# Patient Record
Sex: Female | Born: 1975 | Race: White | Hispanic: No | Marital: Single
Health system: Midwestern US, Community
[De-identification: ages and names within clinical notes are randomized; demographics above are authoritative.]

## PROBLEM LIST (undated history)

## (undated) DIAGNOSIS — M5020 Other cervical disc displacement, unspecified cervical region: Secondary | ICD-10-CM

## (undated) DIAGNOSIS — M47812 Spondylosis without myelopathy or radiculopathy, cervical region: Principal | ICD-10-CM

## (undated) DIAGNOSIS — M25561 Pain in right knee: Secondary | ICD-10-CM

## (undated) DIAGNOSIS — M21611 Bunion of right foot: Secondary | ICD-10-CM

---

## 2011-04-20 IMAGING — US US ABDOMEN COMPLETE
1 series · 14 of 25 positions shown · non-contrast
Comparison: none

HISTORY: Left upper quadrant pain.
TECHNIQUE: Abdominal ultrasound.

[Series 1: us abdomen complete · 0.24mm/px · 14 of 39 slices shown]
[im 1/39]
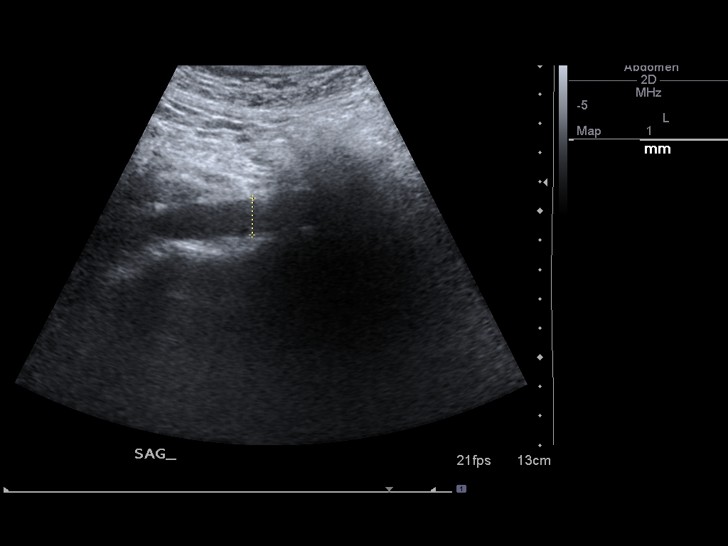
[im 4/39]
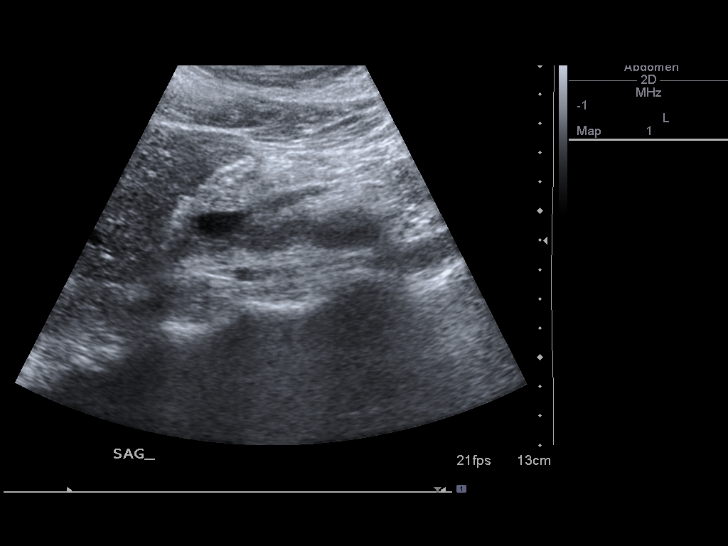
[im 7/39]
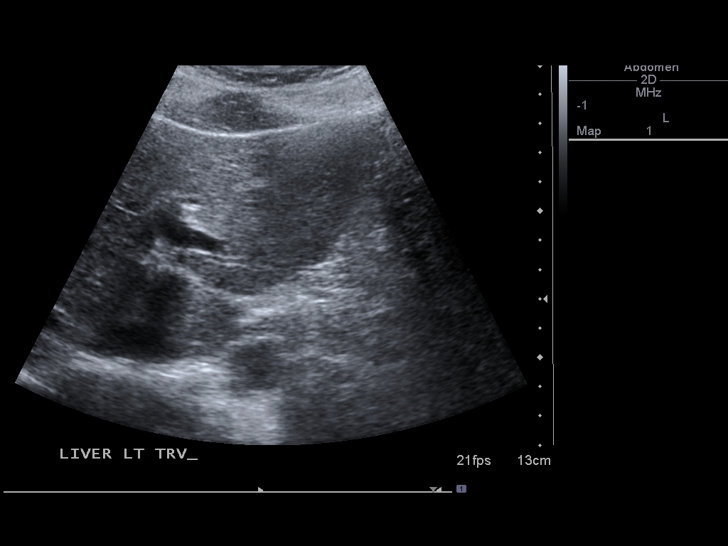
[im 10/39]
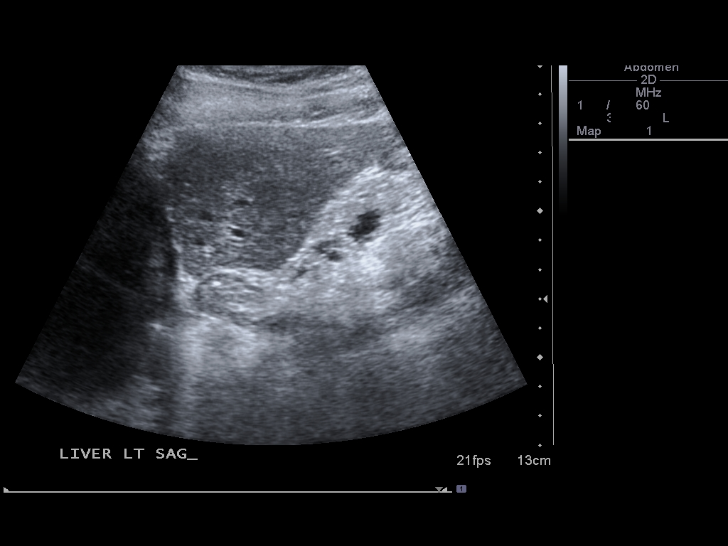
[im 13/39]
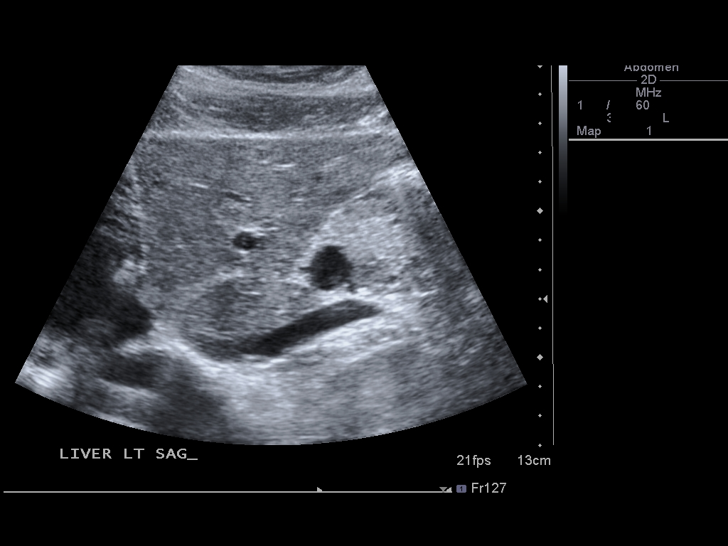
[im 15/39]
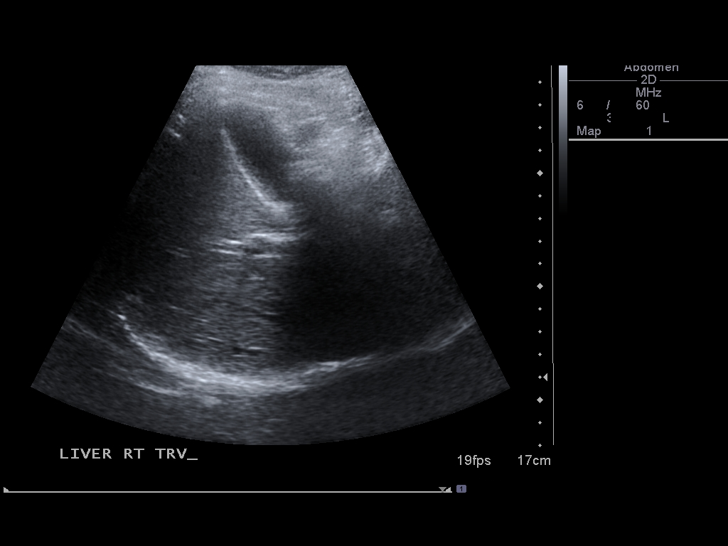
[im 18/39]
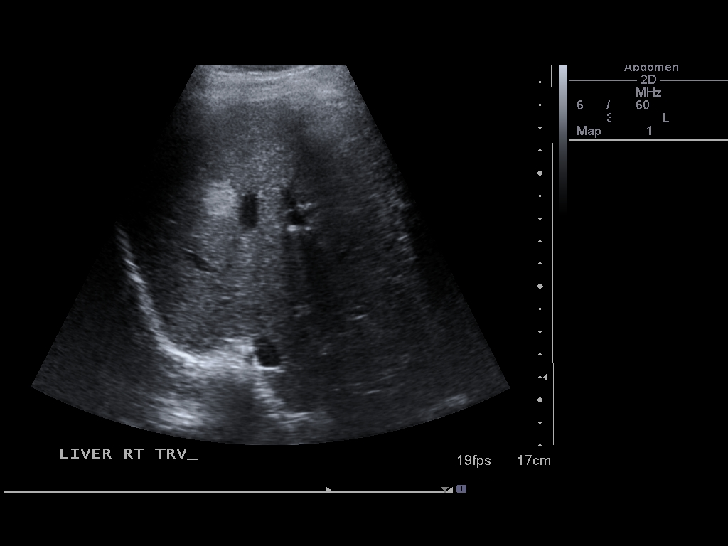
[im 21/39]
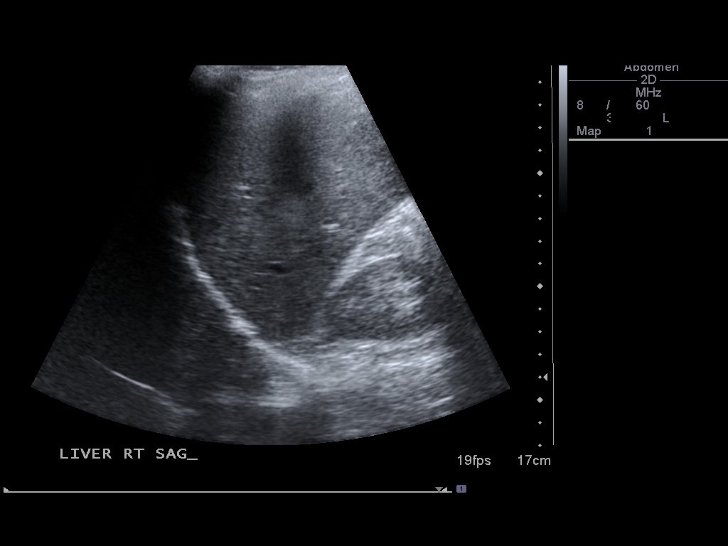
[im 24/39]
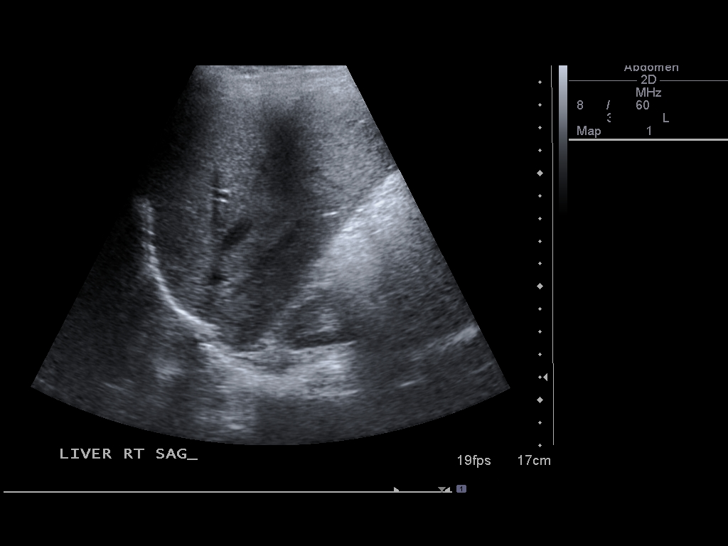
[im 26/39]
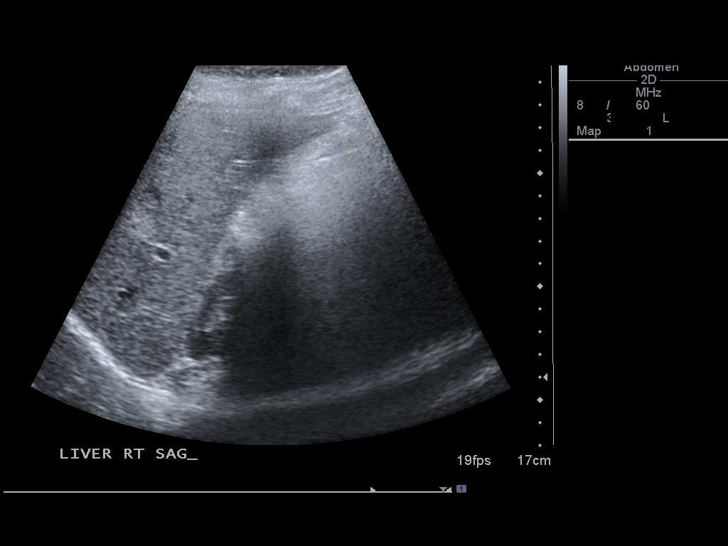
[im 29/39]
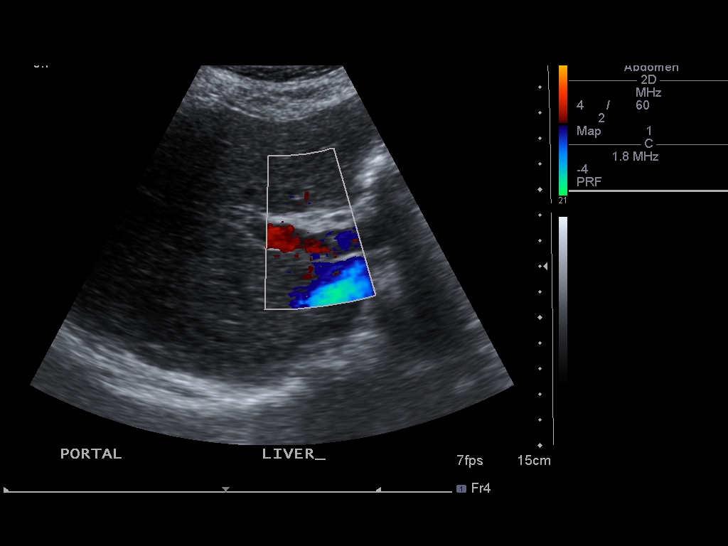
[im 32/39]
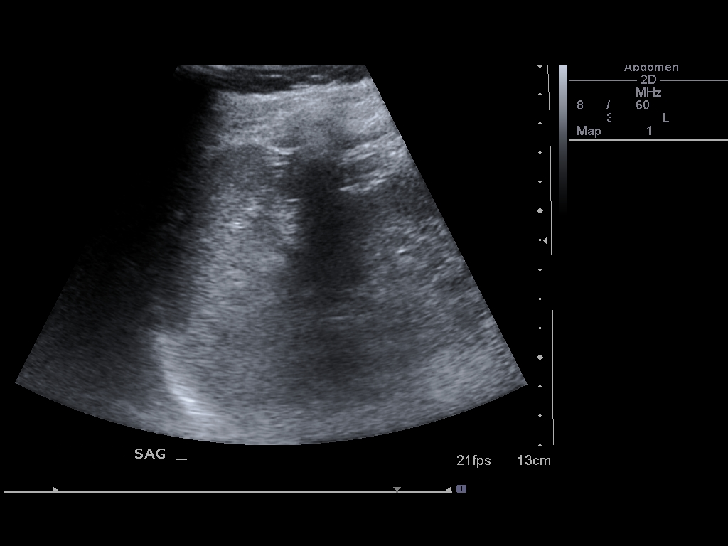
[im 35/39]
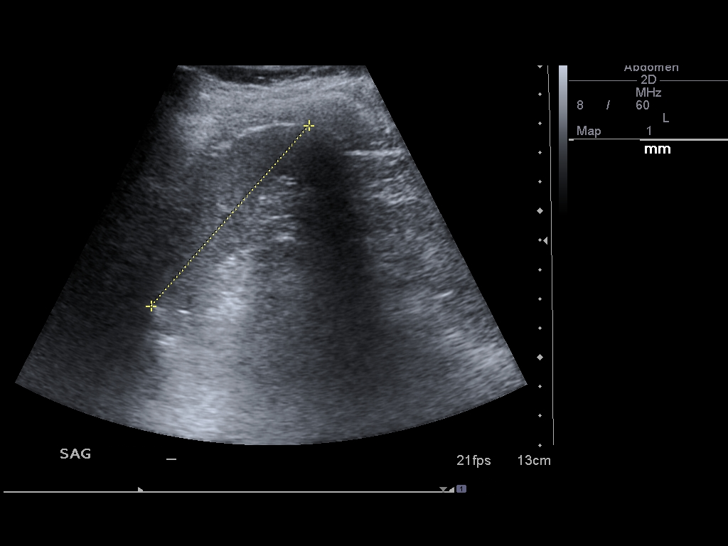
[im 39/39]
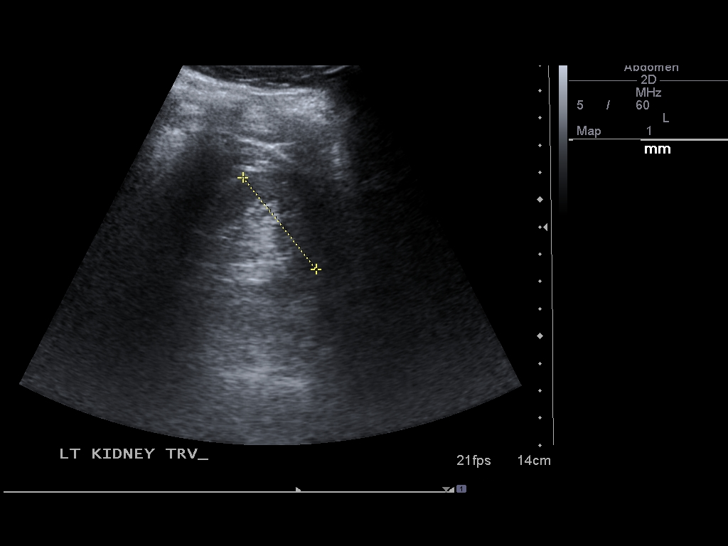

[14 of 25 positions shown; findings below may reference images not displayed]

FINDINGS: The gallbladder shows some mobile debris, no stones, common duct 4 mm, negative sonographic Murphy's sign, gallbladder wall normal.

Liver shows several hypoechoic areas, one in left lobe 11 x 15 mm, in the right lobe 17 x 16 mm. Right lobe is about 14 cm in length. Portal vein flow is appropriately toward the liver.

Aorta and IVC within the abdomen unremarkable.  Pancreas partially seen, appears normal but may not be completely seen.

Spleen is 82 x 30 mm.

Right kidney 106 x 36 x 38 mm, left kidney 107 x 65 x 63 mm.

On previous renal ultrasound from 7434, hyperechoic area in right lobe of about 15 mm was described but the liver is not studied in detail at that time.
IMPRESSION: 1. The liver lesions appear to be homogeneously smooth and hyperechoic are compatible with hemangiomas most likely. View of pain, however, consider followup hemangioma protocol CT.

2. There is some debris without definite stones in gallbladder. No wall thickening. No common duct enlargement.

## 2011-04-20 IMAGING — US US NON OB TRANSVAG W LTD TA
1 series · 14 of 28 positions shown · non-contrast
Comparison: none

HISTORY: Left lower quadrant pain.
TECHNIQUE: Pelvic ultrasound transvaginal and transabdominal.

[Series 1: us non ob transvag w ltd ta · 0.23mm/px · 14 of 39 slices shown]
[im 2/39]
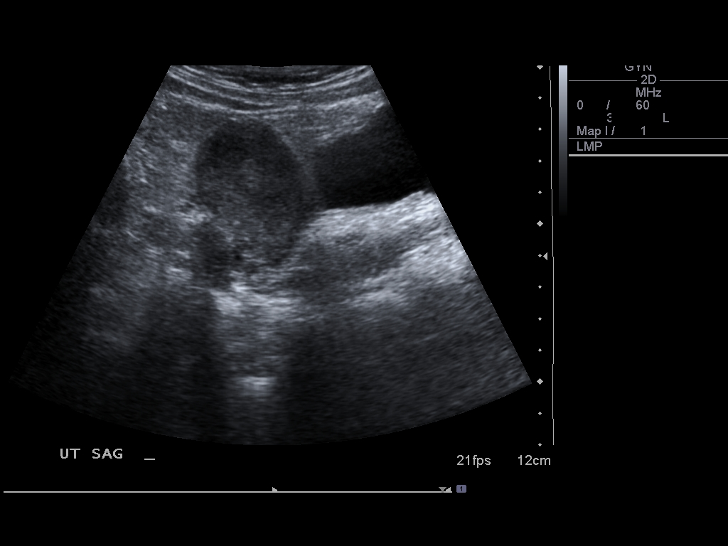
[im 5/39]
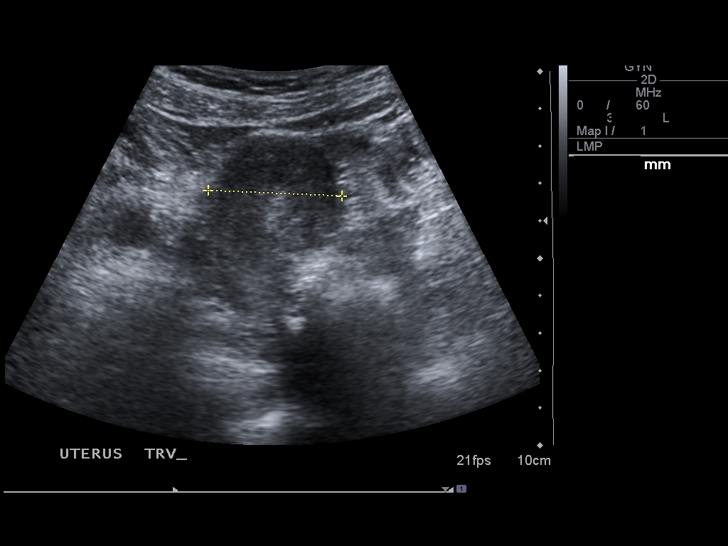
[im 8/39]
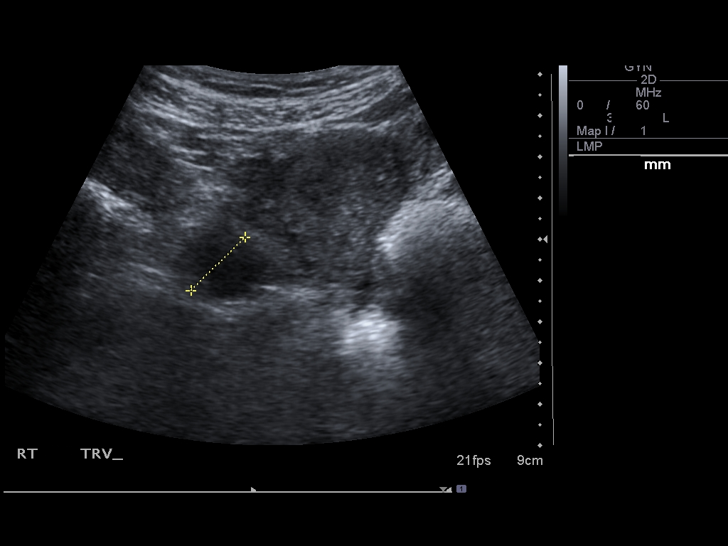
[im 10/39]
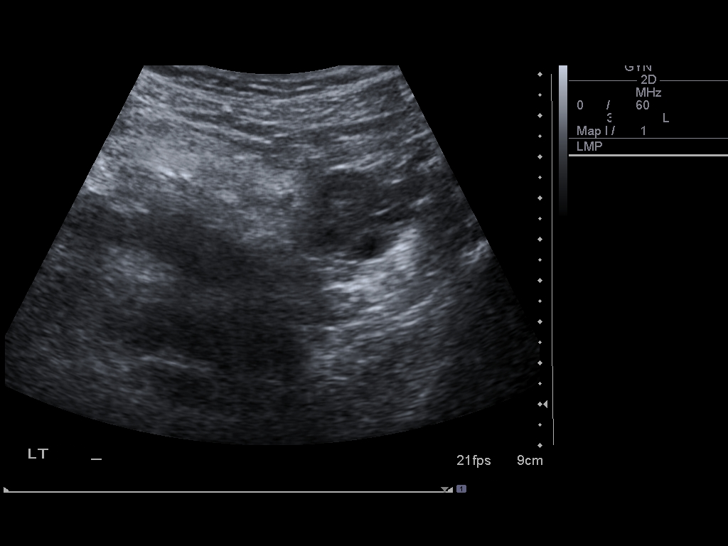
[im 13/39]
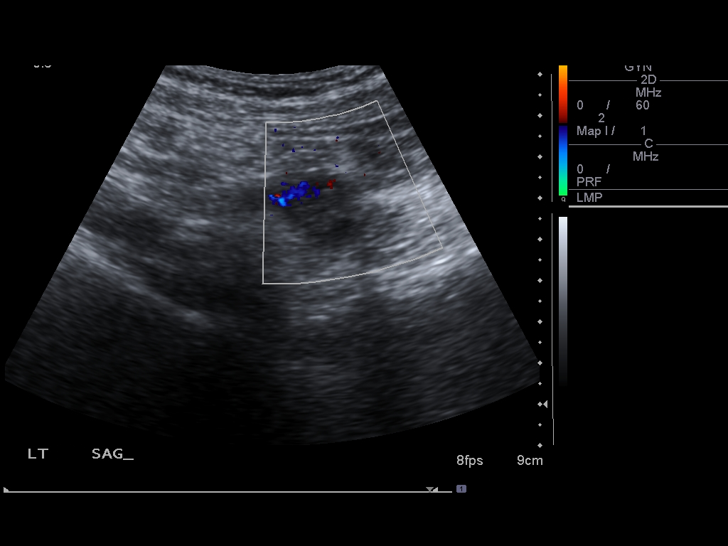
[im 16/39]
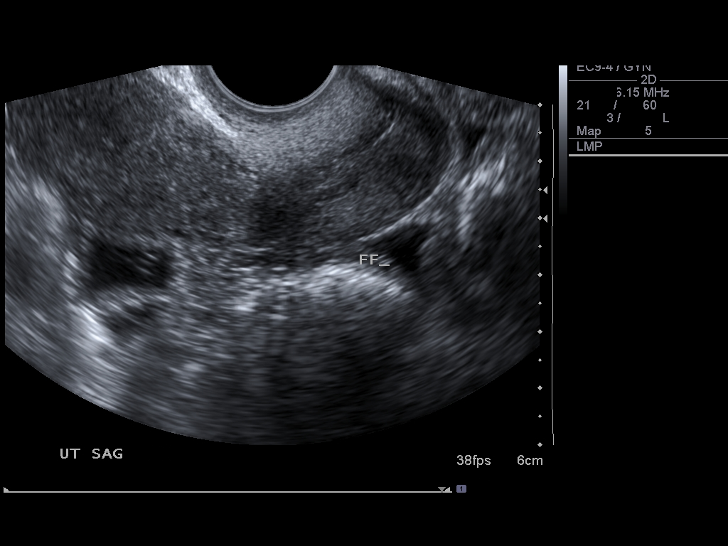
[im 19/39]
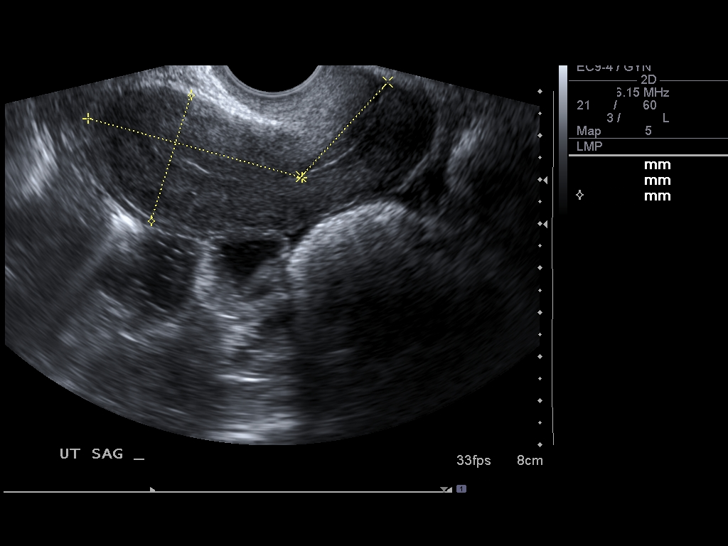
[im 22/39]
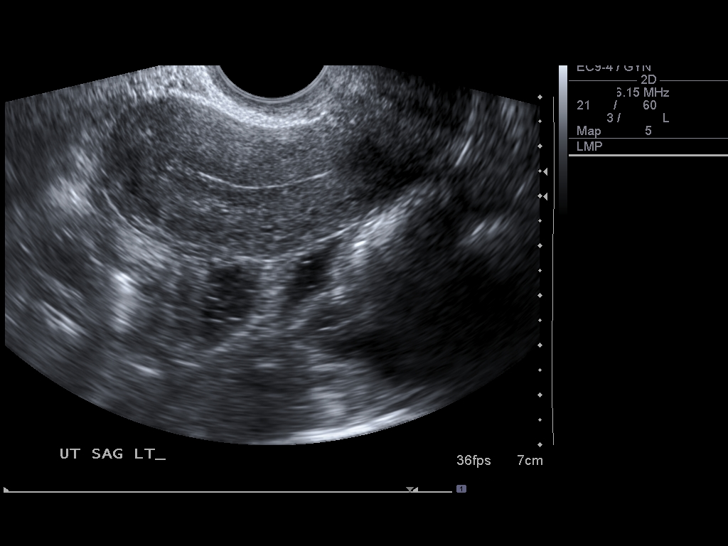
[im 24/39]
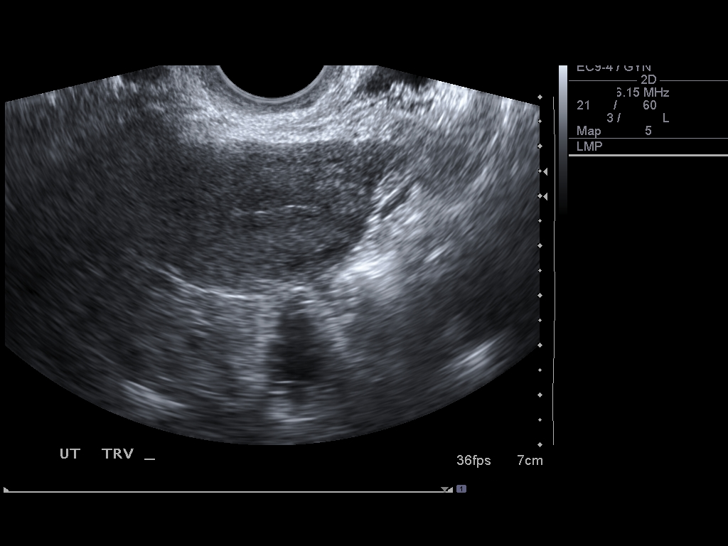
[im 27/39]
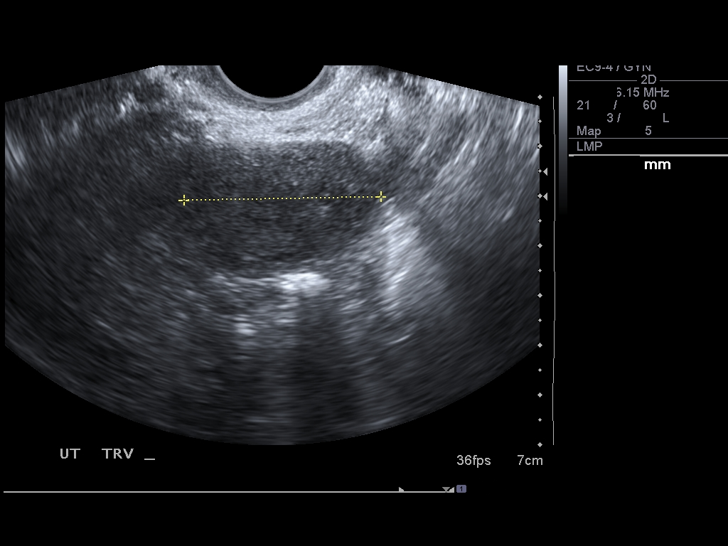
[im 30/39]
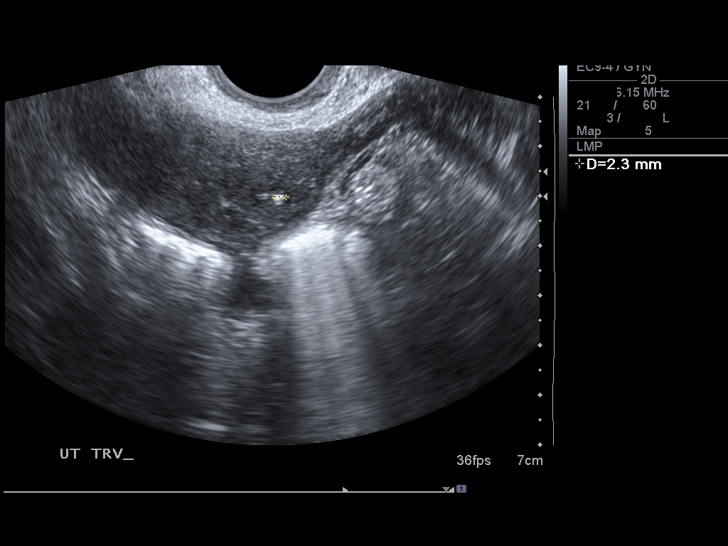
[im 33/39]
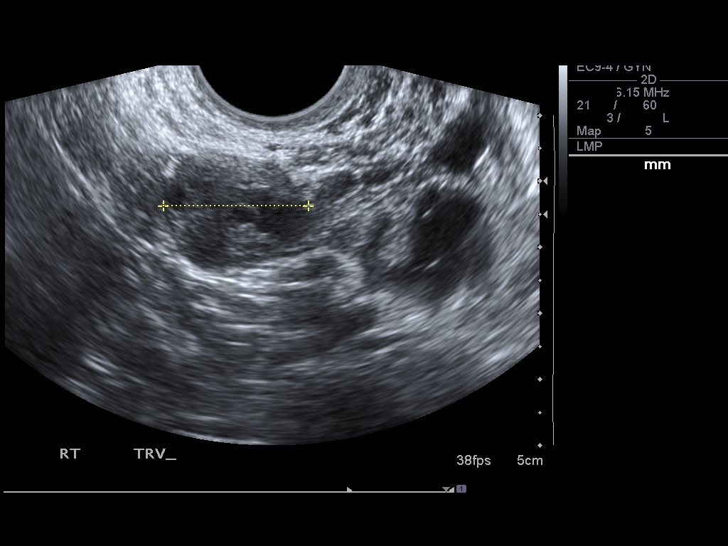
[im 36/39]
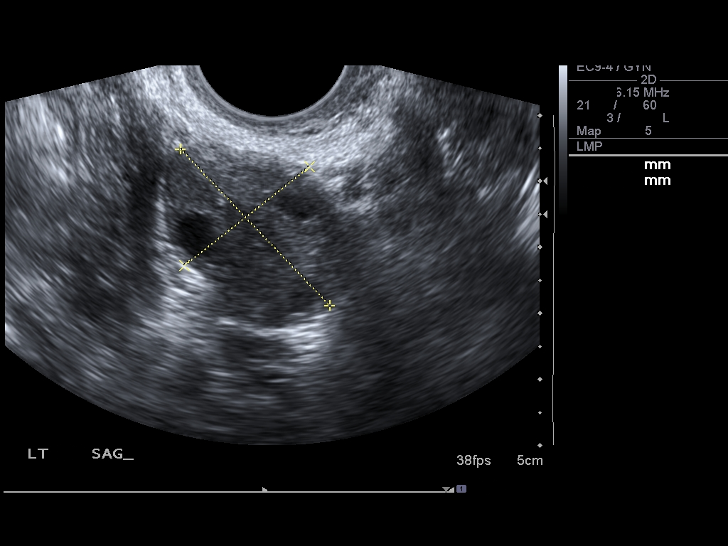
[im 39/39]
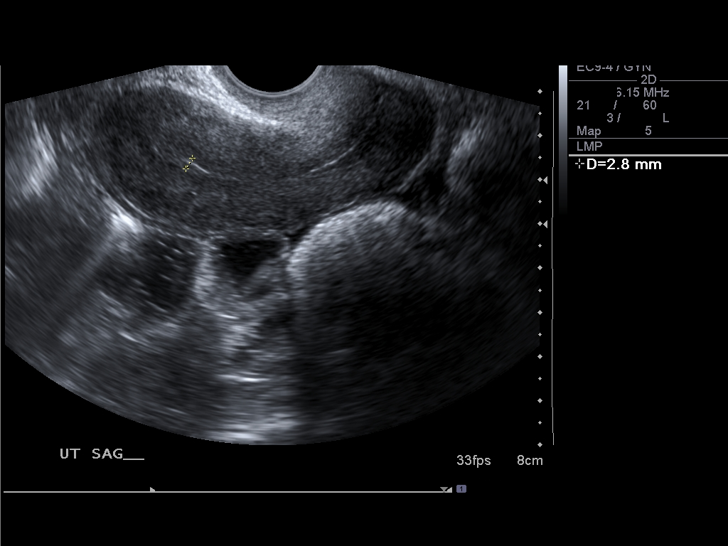

[14 of 28 positions shown; findings below may reference images not displayed]

FINDINGS: Uterus is 79 x 30 x 40 mm. Endometrium 3 mm. Small endometrial calcification seen of about 3 mm.

Right ovary is 26 x 16 x 22 mm, left ovary 33 x 24 x 19 mm.

Small amount of fluid is seen in cul-de-sac. The ovaries show several follicles bilaterally.
IMPRESSION: 1. Small amount of fluid in cul-de-sac.

2. Suggest small endometrial calcification.

3. No uterine fibroid seen other than the small calcification which could be a small submucosal calcified fibroid but is only 3 mm.

## 2011-11-26 IMAGING — US US ABDOMEN COMPLETE
1 series · 13 of 25 positions shown · non-contrast
Comparison: none

HISTORY: Abdomen pain, hemangioma.

Comparison made to abdomen ultrasound study 04/20/2011 and renal ultrasound study 02/26/2008.
TECHNIQUE: Abdomen ultrasound study performed.

[Series 1: us abdomen complete · 0.24mm/px · 13 of 56 slices shown]
[im 1/56]
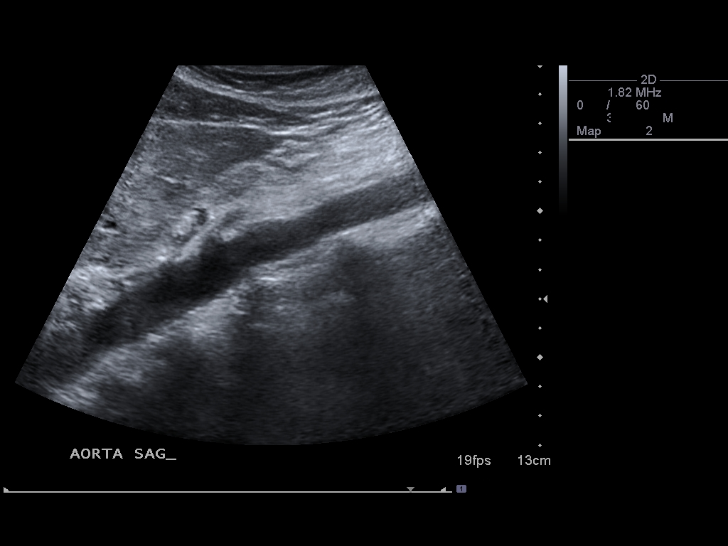
[im 5/56]
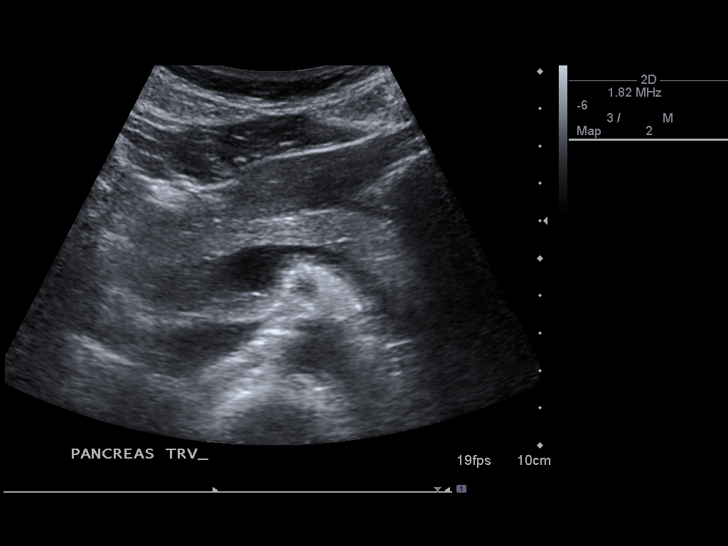
[im 10/56]
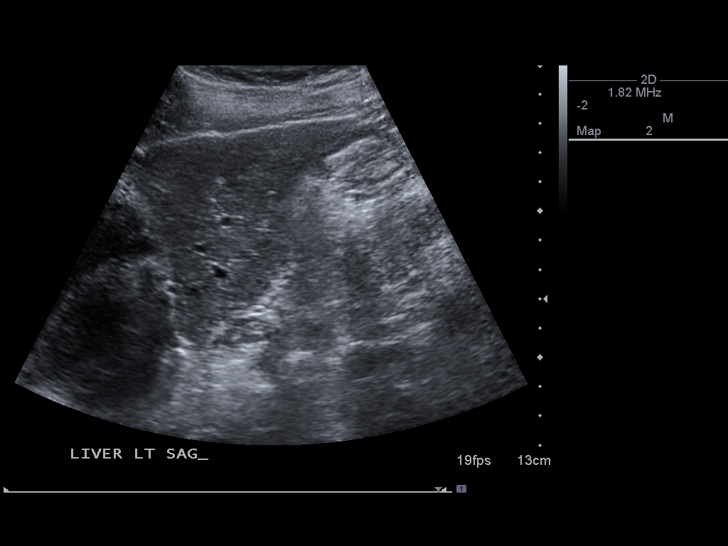
[im 14/56]
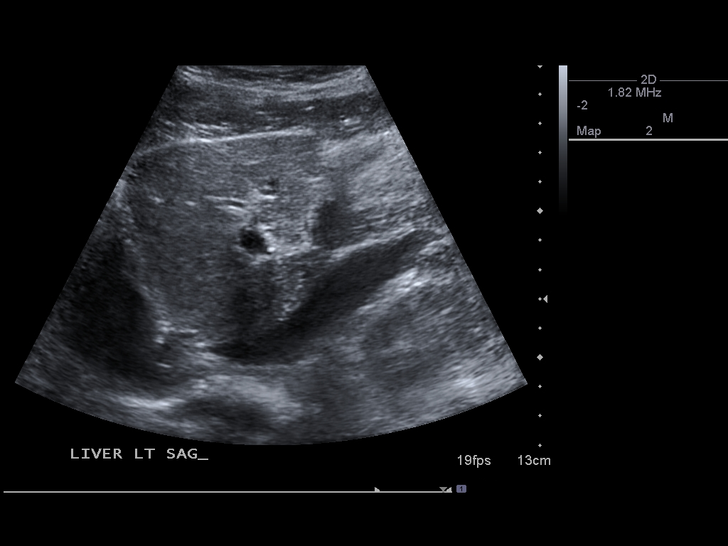
[im 19/56]
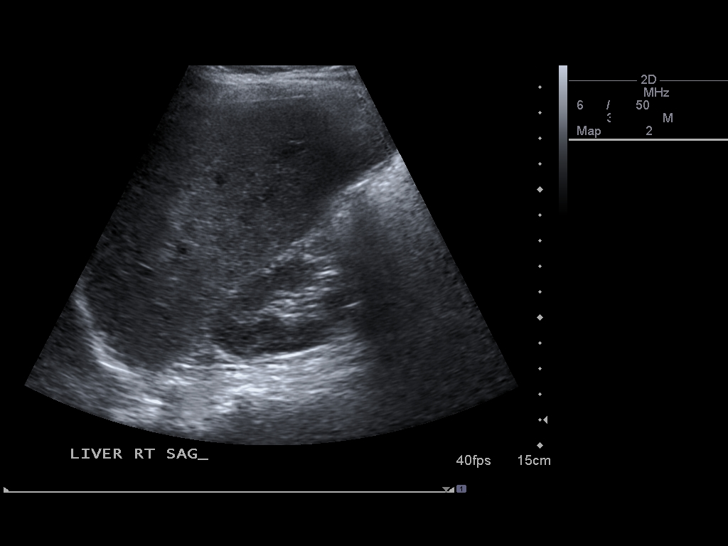
[im 23/56]
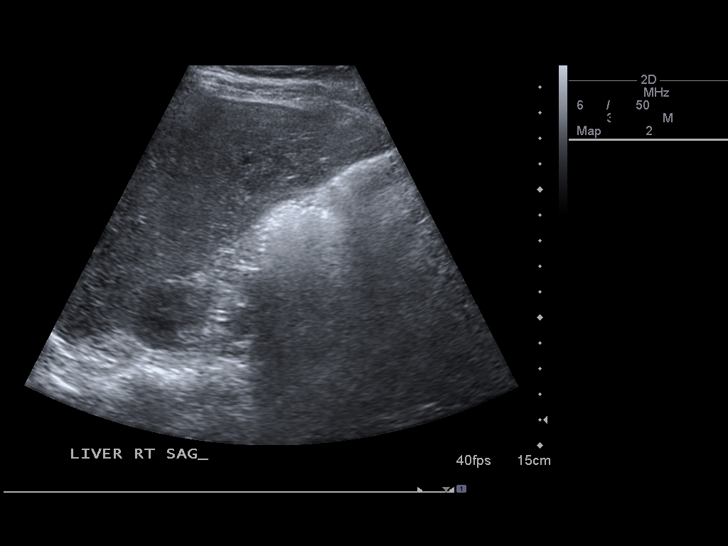
[im 28/56]
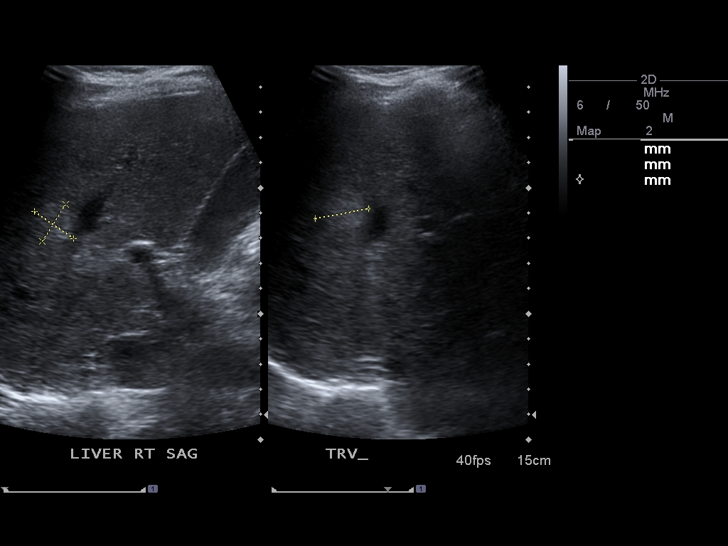
[im 33/56]
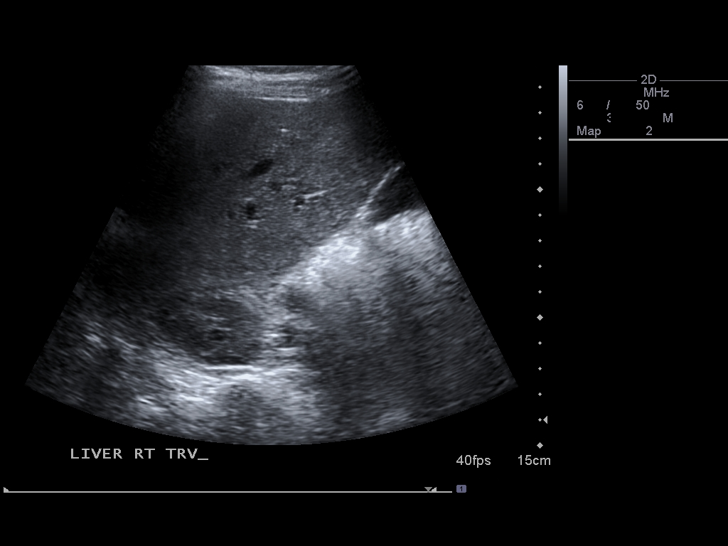
[im 37/56]
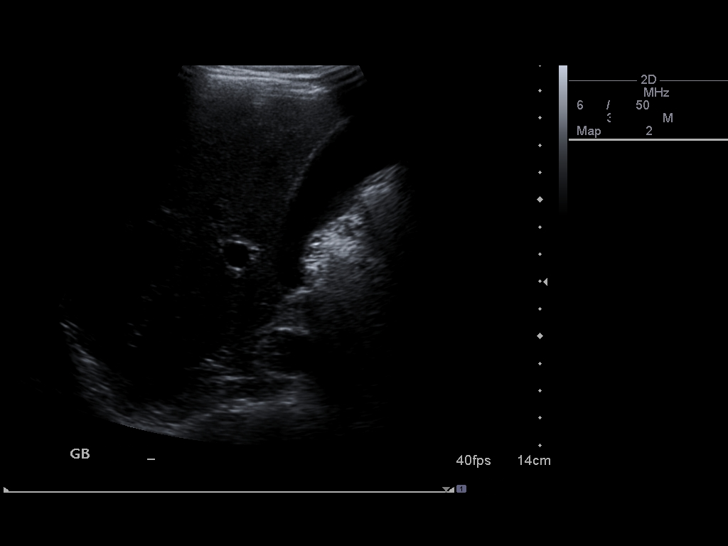
[im 42/56]
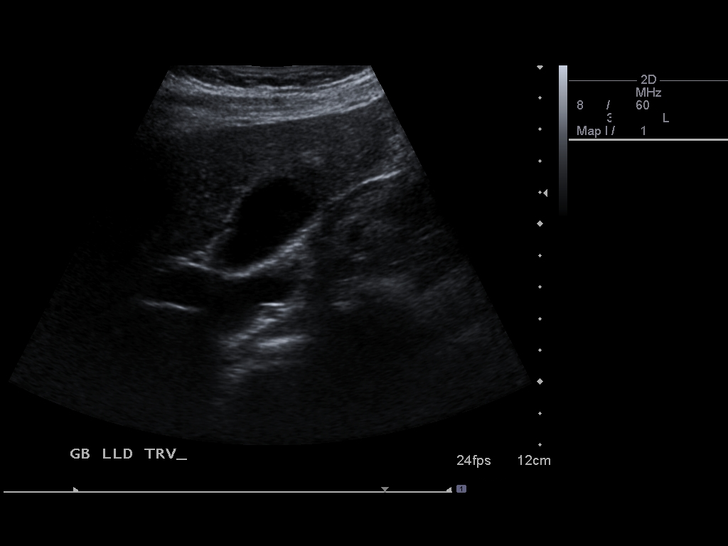
[im 46/56]
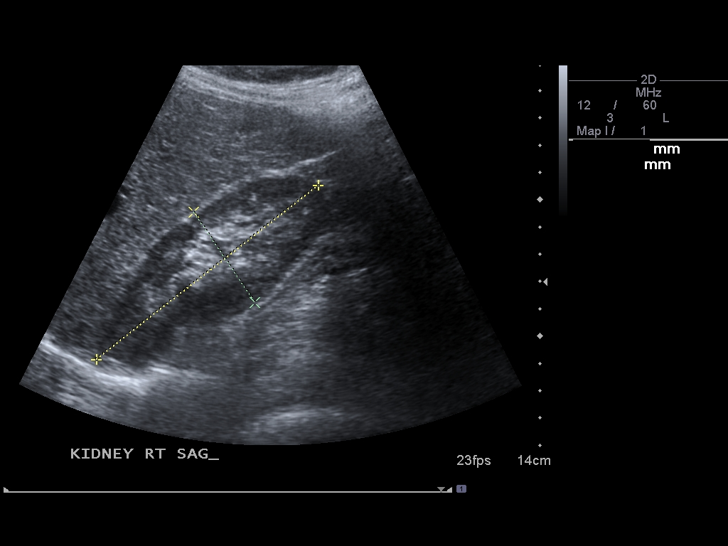
[im 51/56]
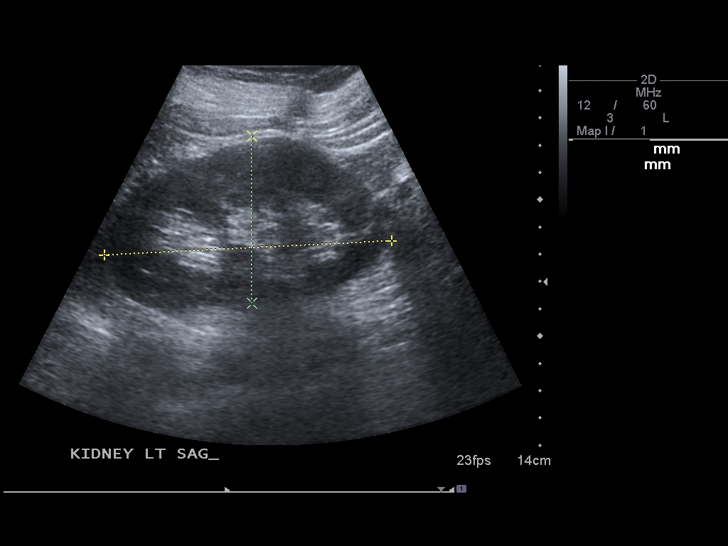
[im 56/56]
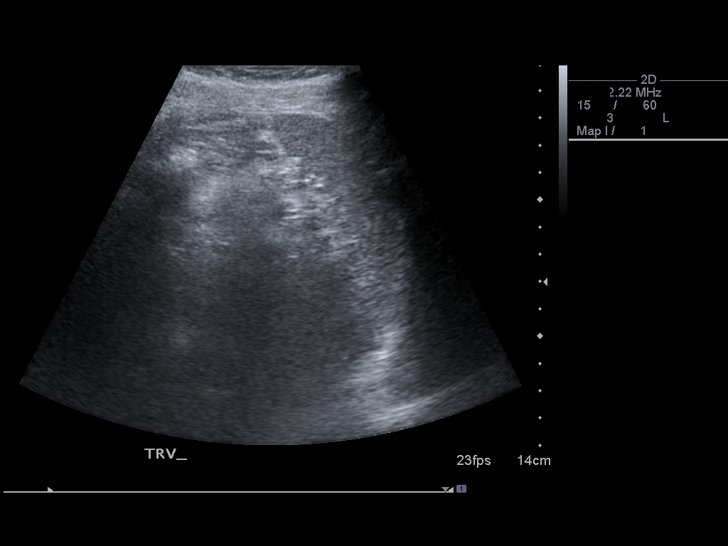

[13 of 25 positions shown; findings below may reference images not displayed]

FINDINGS: The pancreatic head and body are normal. Pancreatic tail is obscured by overlying bowel gas. There is likely mild atherosclerotic irregularity of abdominal aorta without aneurysm. Inferior vena cava is normal.

Liver measures up to 14.6 cm in maximal diameter at mid clavicular line.  Echogenic region of right hepatic lobe up to 19 x 17 x 22 mm identified. Echogenic lesion left hepatic lobe of 15 x 13 x 19 mm also identified. No new liver mass seen. No liver cyst or intrahepatic biliary ductal dilation.

There is no cholelithiasis or gallbladder sludge. Gallbladder wall thickness is normal, measuring 1.9 mm. Sonographic Murphy's sign is negative. Common bile duct is of normal caliber, measuring 4.4 mm in diameter without dilation or choledocholithiasis.

Spleen measures 10.6 x 7.8 cm. There is no splenic mass or cyst.

Right kidney measures 10.3 x 4.0 x 4.7 cm. Left kidney measures 10.5 x 6.1 x 5.8 cm.  There is no renal mass, cyst, hydronephrosis or nephrolithiasis.
IMPRESSION: Hyperechoic lesion of liver again seen, likely due to hemangiomas. There may be slight interval growth. Confirmation study with contrast CT abdomen or MRI abdomen study advised.

No other acute disease process or change.

## 2012-09-27 IMAGING — MG MAMMO SCRN BIL W/CAD TOMO
12 series · 12 of 12 positions shown · non-contrast
Comparison: none

Images Obtained from [facility]
CLINICAL RA REF: Mammo Scrn Bil (Digital) W/Cad Routine screening w/ bilateral augmentation. Baseline exam. Extra tissue under left arm.
Digital images were generated from the 3D Tomosynthesis data acquired during the exam.
No prior exams were available for comparison.
The tissue of both breasts is heterogeneously dense. This may lower the sensitivity of mammography.
Current study was also evaluated with a Computer Aided Detection (CAD) system.
Bilateral saline implants are intact.
There is an asymmetry in the left axillary tail.  This correlates as palpated.
No other significant abnormalities are seen in either breast.
Your patient's mammogram demonstrates that she has dense breast tissue, which could hide small abnormalities.  In compliance with [place] the patient has been sent a letter which informs
her that she has dense breast tissue and might benefit from supplementary screening tests depending on her individual risk factors.  The patient may contact you if she has any questions or concerns.

[L CC (1 of 2)]
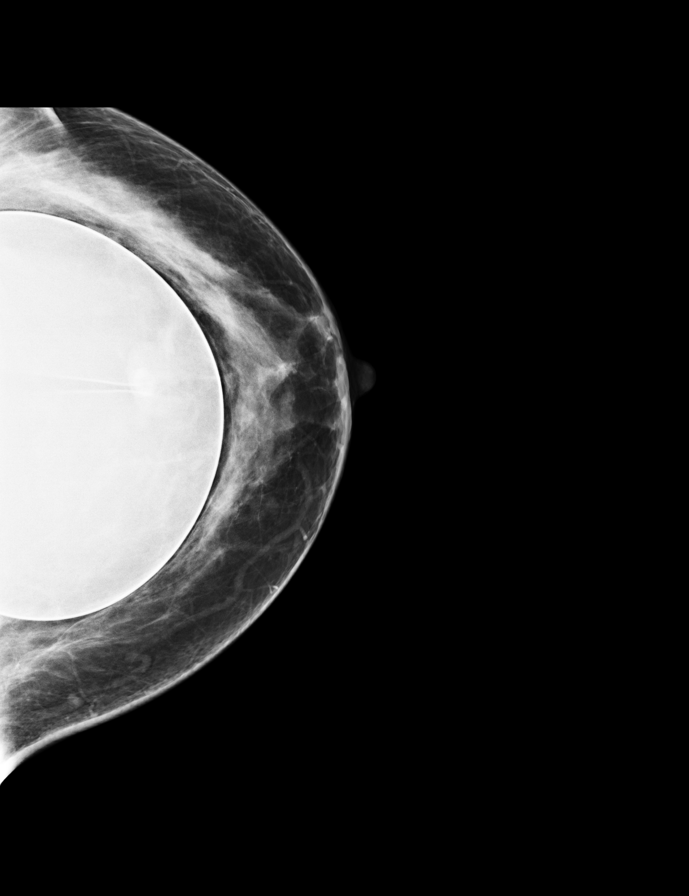

[R MLO (1 of 2)]
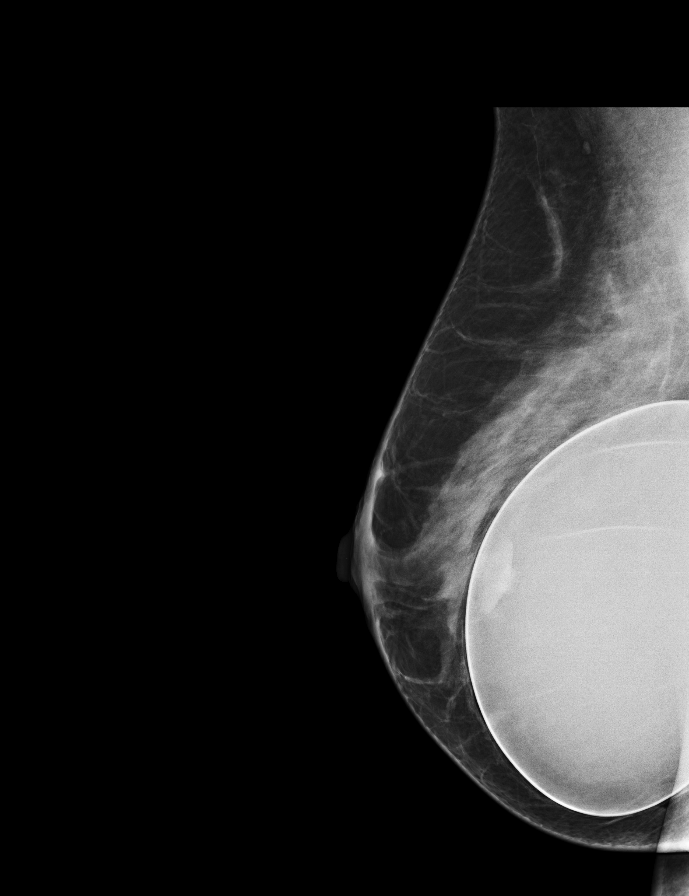

[R CC (1 of 2)]
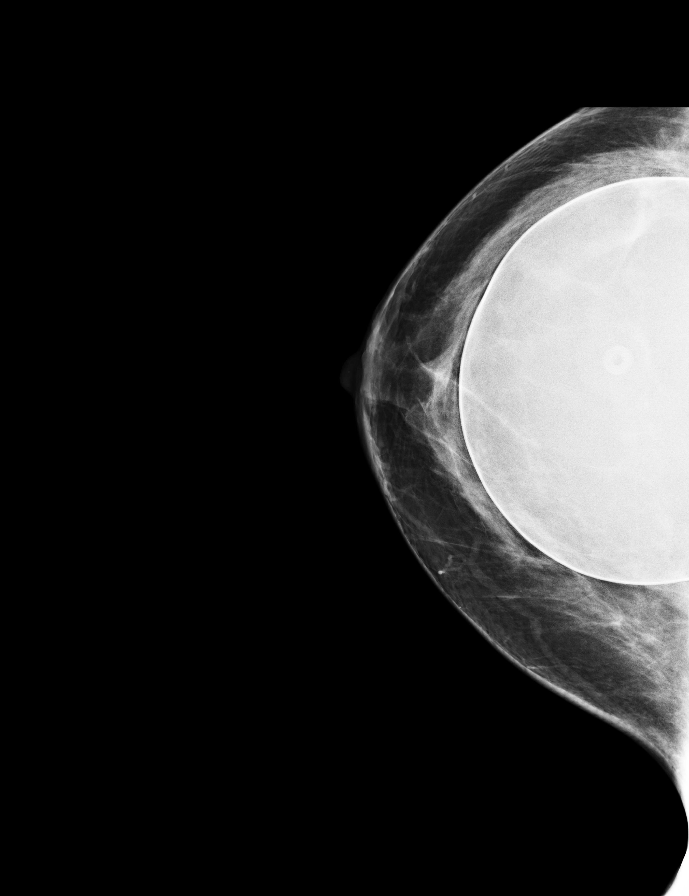

[L MLO (1 of 2)]
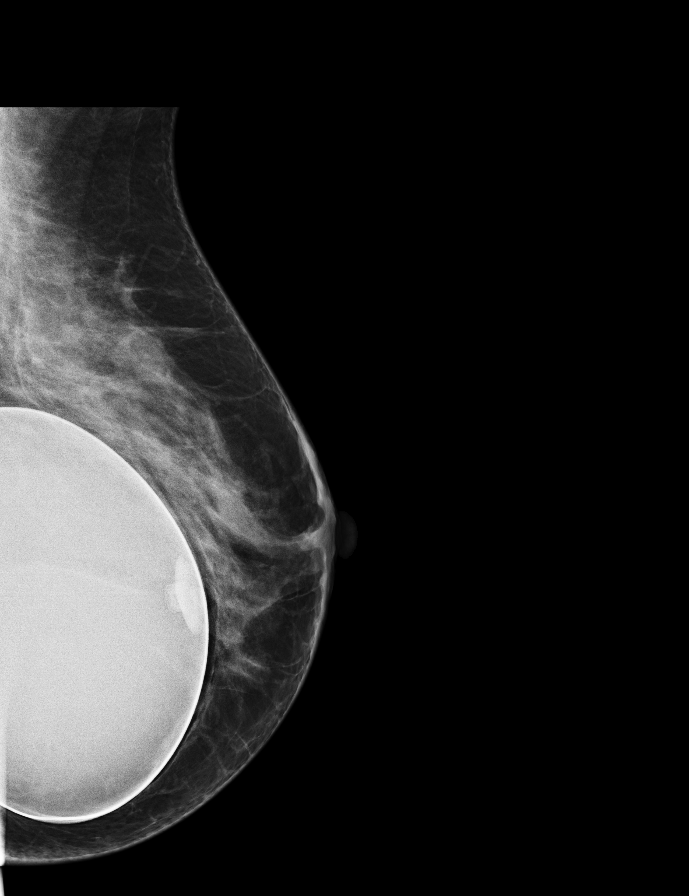

[L CC (2 of 2)]
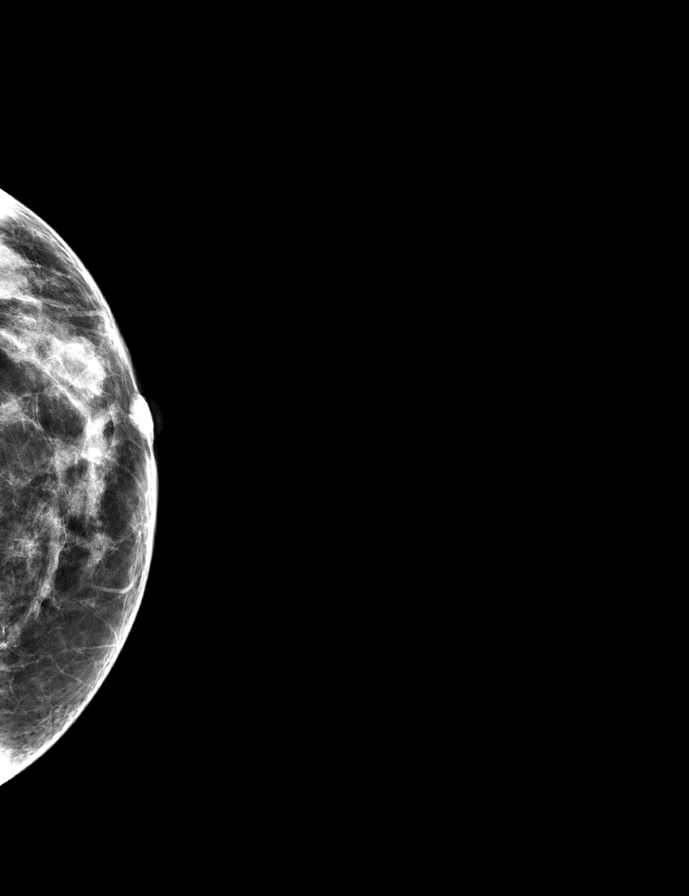

[R CC (2 of 2)]
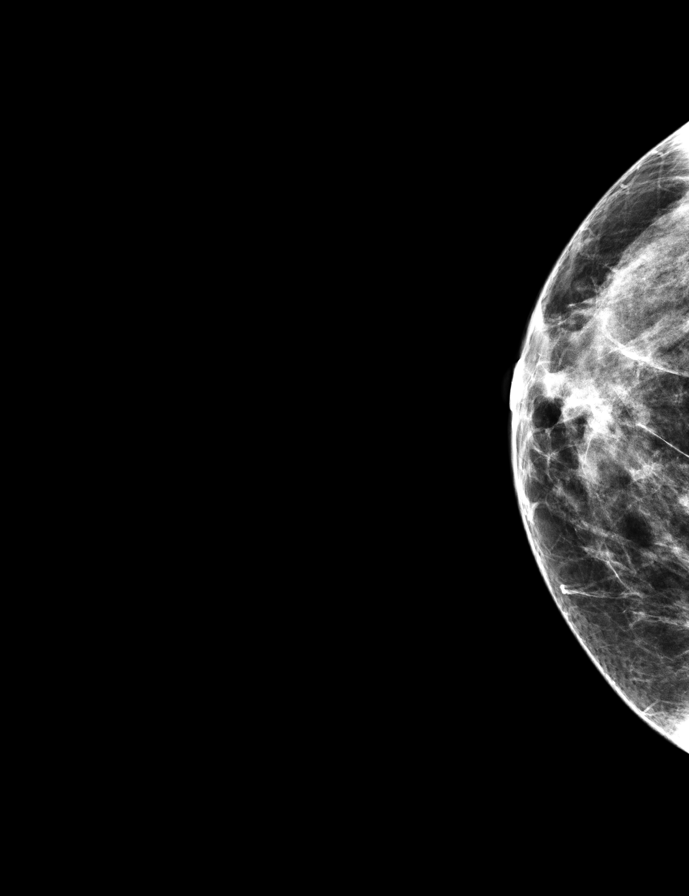

[L MLO (2 of 2)]
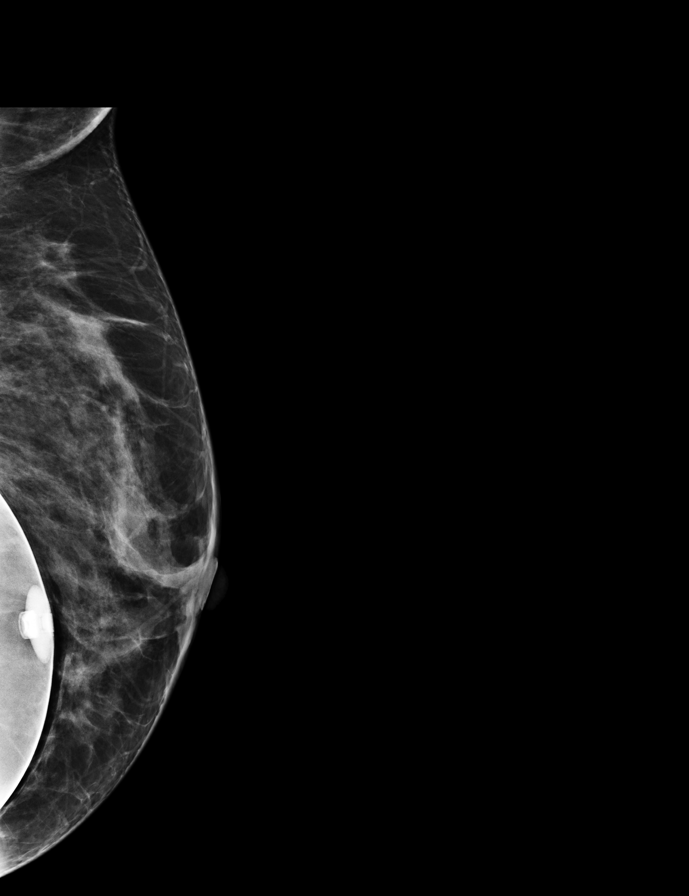

[R MLO (2 of 2)]
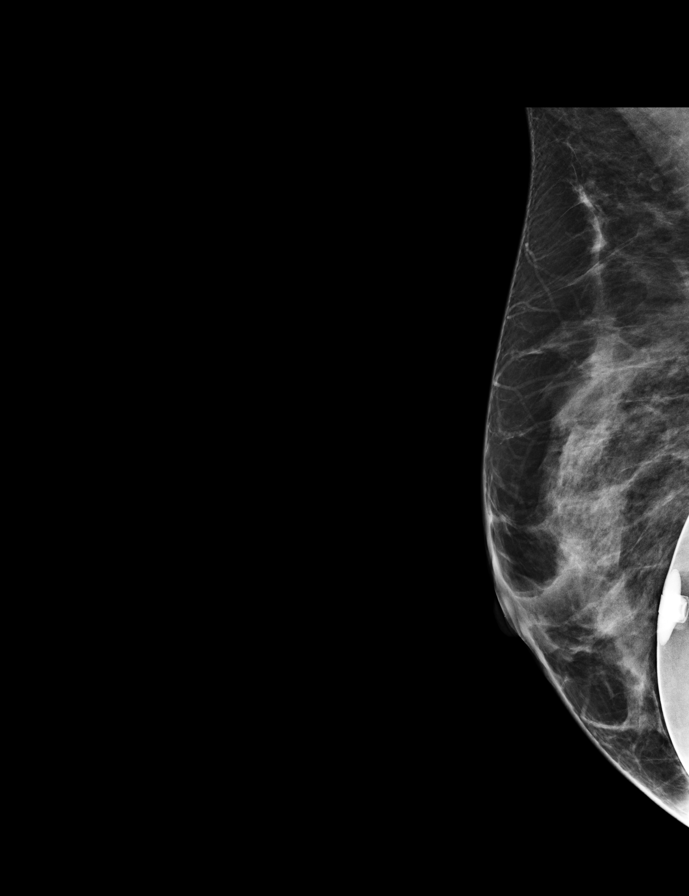

[L CC tomo]
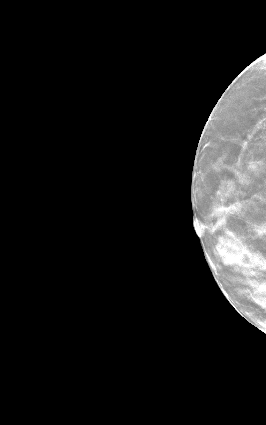

[L MLO tomo]
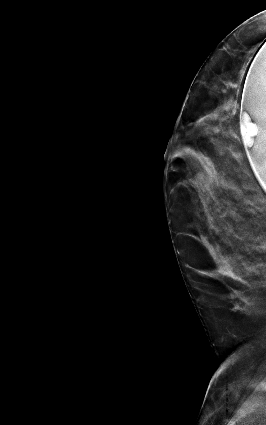

[R MLO tomo]
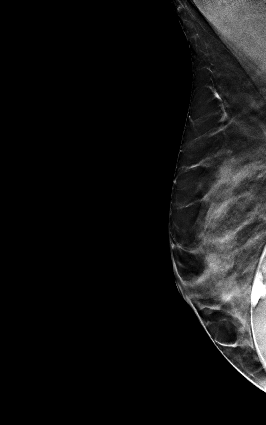

[R CC tomo]
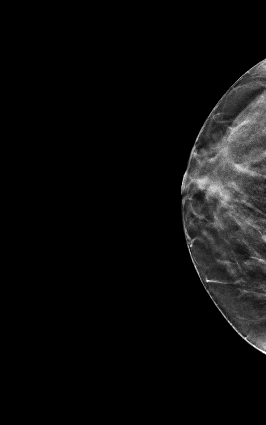

[12 of 12 positions shown; findings below may reference images not displayed]

IMPRESSION: The palpable asymmetry in the left axilla likely represents  accessory fibroglandular tissue.  Ultrasound is required to complete the evaluation. A screening mammogram is not adequate for the
evaluation of a palpable abnormality.
SUMMARY: Ultrasound is required to complete the evaluation. A screening mammogram is not adequate for the evaluation of a palpable abnormality.
mwm/:09/28/2012 [DATE]
letter sent: Birad 0
Mammogram BI-RADS: 0 INCOMPLETE:NEED ADDITIONAL IMAGING EVALUATION   IFGFG v98.25

## 2012-10-10 IMAGING — US US BREAST LT
1 series · 7 of 7 positions shown · non-contrast
Comparison: none

Images Obtained from [facility]
CLINICAL RA REF: Ultrasound of the left breast for palpable abnormality.
Comparison is made to exam dated:  09/27/2012 [HOSPITAL] [facility].
Real-time and Doppler ultrasound of the left breast were performed.
The palpable area of concern corresponds to a small amount of accessory axillary breast tissue. No suspicious sonographic abnormality was seen.

[Series 1: us breast left · 7 of 7 slices shown]
[im 1/7]
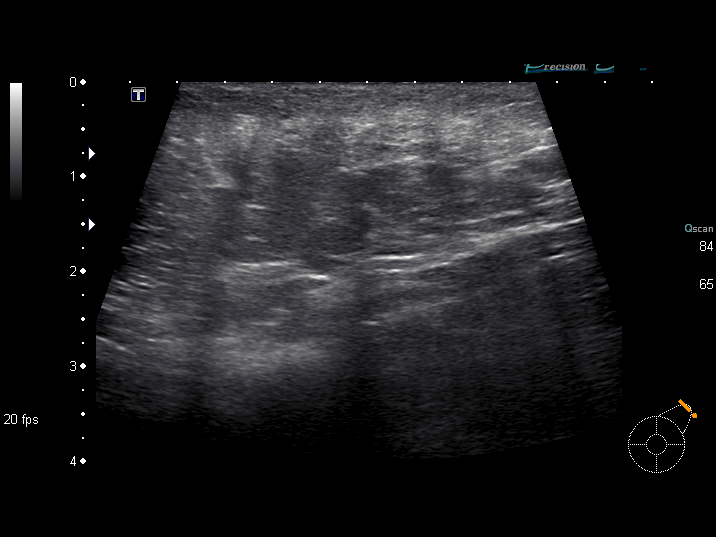
[im 2/7]
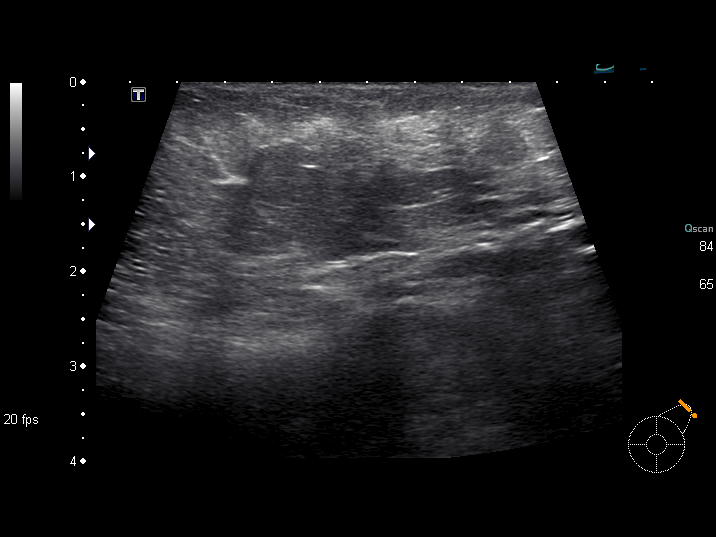
[im 3/7]
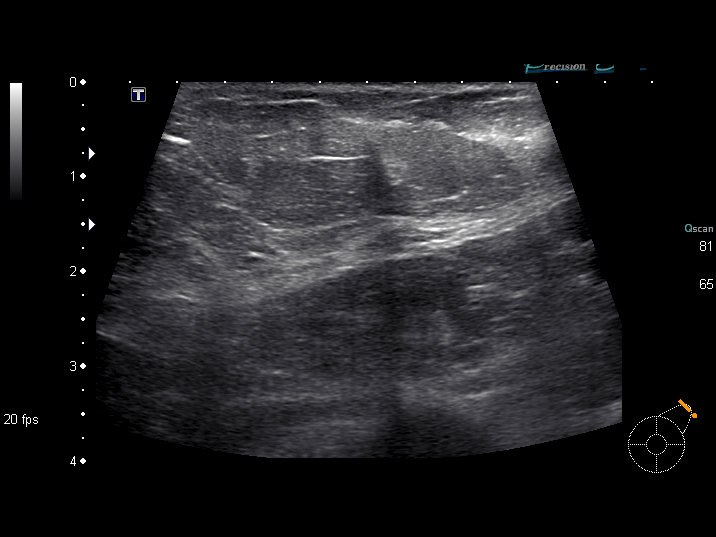
[im 4/7]
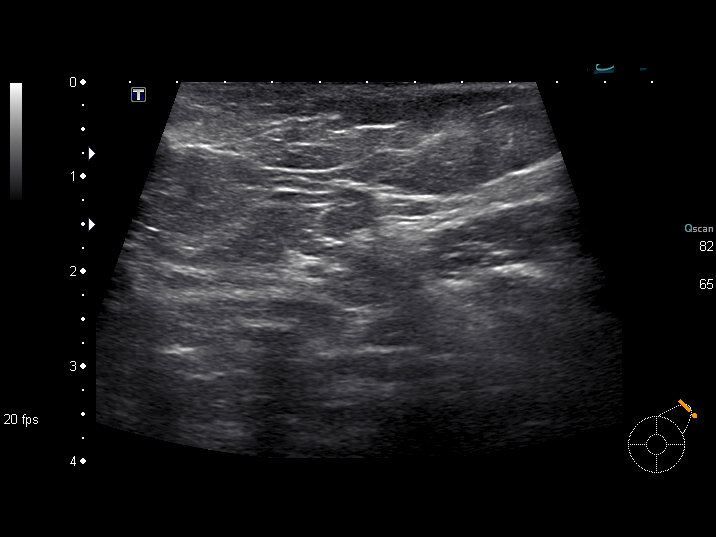
[im 5/7]
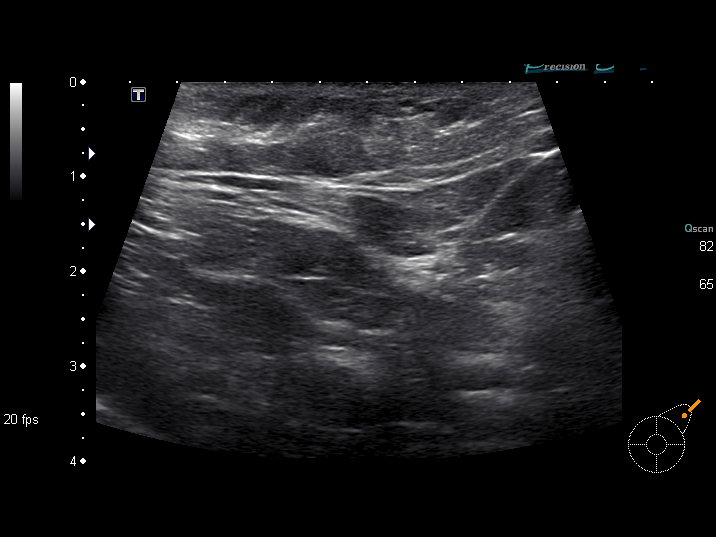
[im 6/7]
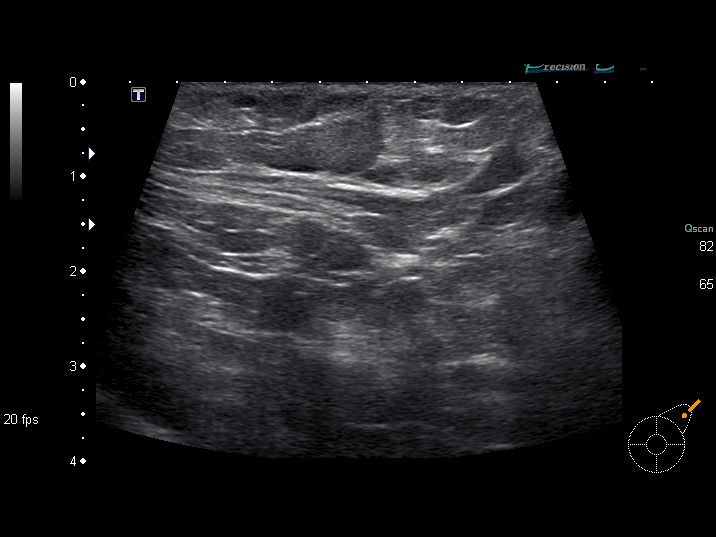
[im 7/7]
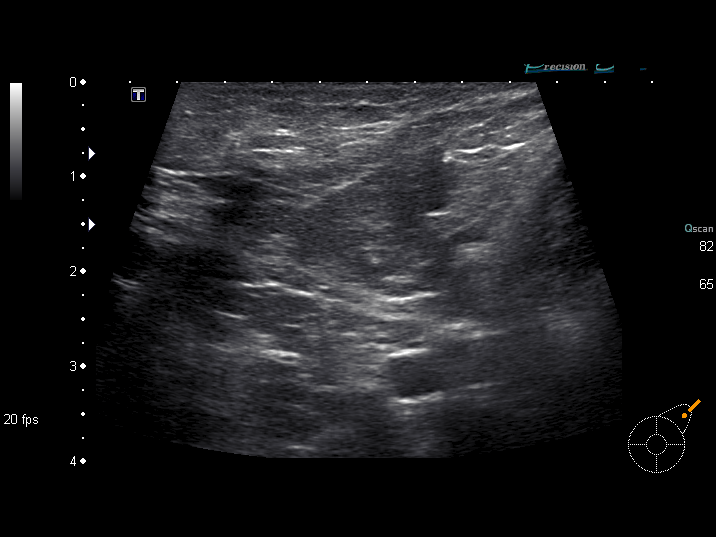

[7 of 7 positions shown; findings below may reference images not displayed]

IMPRESSION: There is no sonographic evidence of malignancy.
A 1 year screening mammogram is recommended.
SUMMARY: Findings and recommendations were discussed with the patient. A negative mammographic or ultrasonographic report should not deter biopsy of a clinically suspicious palpable mass.
Hermund Dybala M.D.
mwm/:10/10/2012 [DATE]
letter sent: Normal
Ultrasound BI-RADS: 2 Benign

## 2014-03-14 ENCOUNTER — Ambulatory Visit: Admit: 2014-03-14 | Discharge: 2014-03-26 | Payer: BLUE CROSS/BLUE SHIELD | Primary: Family Medicine

## 2014-03-14 ENCOUNTER — Encounter

## 2014-03-14 ENCOUNTER — Inpatient Hospital Stay: Attending: Neurological Surgery | Primary: Family Medicine

## 2014-03-14 DIAGNOSIS — M5020 Other cervical disc displacement, unspecified cervical region: Secondary | ICD-10-CM

## 2015-05-13 ENCOUNTER — Encounter: Payer: BLUE CROSS/BLUE SHIELD | Attending: Orthopaedic Surgery | Primary: Family Medicine

## 2015-05-15 ENCOUNTER — Ambulatory Visit
Admit: 2015-05-15 | Discharge: 2015-05-15 | Payer: BLUE CROSS/BLUE SHIELD | Attending: Orthopaedic Surgery | Primary: Family Medicine

## 2015-05-15 DIAGNOSIS — M79604 Pain in right leg: Secondary | ICD-10-CM
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
## 2015-08-28 NOTE — Telephone Encounter (Signed)
Pt asking if ok to get a cortisone injection in both knees, Dr. Turner Daniels pain mgmt is asking for dr. Jill Alexanders ok

## 2015-08-28 NOTE — Telephone Encounter (Signed)
Pain mgmt was going to do the injection in the knee if they could get an okay from you.

## 2015-08-28 NOTE — Telephone Encounter (Signed)
I could not find any objective findings and her X rays were normal and I have discharged her. Without a definite diagnosis I personally would not inject . Final decision is between her and Dr. Owens Loffler since I already have discharged her.

## 2016-06-16 ENCOUNTER — Encounter: Attending: Anesthesiology | Primary: Family Medicine

## 2016-07-23 ENCOUNTER — Inpatient Hospital Stay
Admit: 2016-07-23 | Discharge: 2016-07-29 | Payer: PRIVATE HEALTH INSURANCE | Attending: Anesthesiology | Primary: Family Medicine

## 2016-07-23 DIAGNOSIS — M47892 Other spondylosis, cervical region: Secondary | ICD-10-CM
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
## 2016-07-23 NOTE — Discharge Instructions (Signed)
PRE-PROCEDURE INSTRUCTIONS                                               Procedure _Right Cervical Facet Joint Injections C 2-3. C 3-4,4-5,5-6,6-7_______________________________and follow up appointment          ON ________________________ ARRIVE________________          FOLLOW UP ________________ARRIVE ________________          X  1.  You MUST shower or bathe the day of your procedure.  Your skin must be as clean as possible. Please wear CLEAN loose fitting clothing. May brush your teeth.         X    2.  Do not eat or drink anything for ___8__ hours prior to procedure.        X    3 Unless otherwise instructed, take medications with a sip of water                                                                          4.  If you are  diabetic, check your blood sugar prior to arrival, do not  take diabetic medications but please bring them with you.      _X___5. Please take your heart , blood pressure medicine and thyroid medicine  with a sip of water; as scheduled.  Use any inhalers as you would regularly take them.      __6.  You must be off the following blood thinners prior to procedure date: Please do not hold any blood thinners until we have directed you to do so.      _____ Plavix  7 days ___ Coumadin  5 days ___  Platel  7 days___     Xeralto__________ Eliquis_________    _____Adult aspirin 325 mg for 14 days __    _____Baby aspirin 81 mg for 7 days __    _____Any anti-inflammatory medications like Ibuprofen, Aleve,  Voltaren, and Mobic for 7 days                 _____Other blood thinners____________        _______If you were given a lab slip for lab work:  Please get labs drawn on________ @__________  go directly to the lab, not admitting. Please only go to the lab if you were given specific instructions by the nurse to do so.      __X___You must arrange for someone to drive you home after your procedure. Failure to do so could result in cancelling your procedure.  Please PARK your car or arranged to  be picked up and or dropped off at the OUT PATIENT SURGERY AREA.    Additional instructions_____________________________________         If any questions or concerns  call [phone],      Healthy Upper Back: Exercises  Your Care Instructions  Here are some examples of exercises for your upper back. Start each exercise slowly. Ease off the exercise if you start to have pain.  Your doctor or physical therapist will tell you when you can start these exercises  and which ones will work best for you.  How to do the exercises  Lower neck and upper back stretch    1. Stretch your arms out in front of your body. Clasp one hand on top of your other hand.  2. Gently reach out so that you feel your shoulder blades stretching away from each other.  3. Gently bend your head forward.  4. Hold for 15 to 30 seconds.  5. Repeat 2 to 4 times.  Midback stretch    If you have knee pain, do not do this exercise.  1. Kneel on the floor, and sit back on your ankles.  2. Lean forward, place your hands on the floor, and stretch your arms out in front of you. Rest your head between your arms.  3. Gently push your chest toward the floor, reaching as far in front of you as possible.  4. Hold for 15 to 30 seconds.  5. Repeat 2 to 4 times.  Shoulder rolls    1. Sit comfortably with your feet shoulder-width apart. You can also do this exercise while standing.  2. Roll your shoulders up, then back, and then down in a smooth, circular motion.  3. Repeat 2 to 4 times.  Wall push-up    1. Stand against a wall with your feet about 12 to 24 inches back from the wall. If you feel any pain when you do this exercise, stand closer to the wall.  2. Place your hands on the wall slightly wider apart than your shoulders, and lean forward.  3. Gently lean your body toward the wall. Then push back to your starting position. Keep the motion smooth and controlled.  4. Repeat 8 to 12 times.  Resisted shoulder blade squeeze    For this exercise, you will need  elastic exercise material, such as surgical tubing or Thera-Band.  1. Sit or stand, holding the band in both hands in front of you. Keep your elbows close to your sides, bent at a 90-degree angle. Your palms should face up.  2. Squeeze your shoulder blades together, and move your arms to the outside, stretching the band. Be sure to keep your elbows at your sides while you do this.  3. Relax.  4. Repeat 8 to 12 times.  Resisted rows    For this exercise, you will need elastic exercise material, such as surgical tubing or Thera-Band.  1. Put the band around a solid object, such as a bedpost, at about waist level. Hold one end of the band in each hand.  2. With your elbows at your sides and bent to 90 degrees, pull the band back to move your shoulder blades toward each other. Return to the starting position.  3. Repeat 8 to 12 times.  Follow-up care is a key part of your treatment and safety. Be sure to make and go to all appointments, and call your doctor if you are having problems. It's also a good idea to know your test results and keep a list of the medicines you take.  Where can you learn more?  Go to [email or web address] and sign in to your MyChart account. Enter [id number] in the Search Health Information box to learn more about "Healthy Upper Back: Exercises."     If you do not have an account, please click on the "Sign Up Now" link.  Current as of: December 24, 2015  Content Version: 11.6   2006-2018 [employer]. Care instructions adapted under license by  [facility]. If you have questions about a medical condition or this instruction, always ask your healthcare professional. [employer] disclaims any warranty or liability for your use of this information.

## 2016-08-09 ENCOUNTER — Inpatient Hospital Stay: Admit: 2016-08-09 | Payer: PRIVATE HEALTH INSURANCE | Attending: Anesthesiology | Primary: Family Medicine

## 2016-08-09 ENCOUNTER — Inpatient Hospital Stay: Admit: 2016-08-09 | Payer: PRIVATE HEALTH INSURANCE | Primary: Family Medicine

## 2016-08-09 DIAGNOSIS — M47812 Spondylosis without myelopathy or radiculopathy, cervical region: Secondary | ICD-10-CM

## 2016-08-09 MED ORDER — MIDAZOLAM HCL 2 MG/2ML IJ SOLN
2 MG/ML | Freq: Once | INTRAMUSCULAR | Status: AC | PRN
Start: 2016-08-09 — End: 2016-08-09
  Administered 2016-08-09: 13:00:00 2 via INTRAVENOUS

## 2016-08-09 MED FILL — BUPIVACAINE HCL (PF) 0.5 % IJ SOLN: 0.5 % | INTRAMUSCULAR | Qty: 30

## 2016-08-09 MED FILL — KENALOG 40 MG/ML IJ SUSP: 40 MG/ML | INTRAMUSCULAR | Qty: 2

## 2016-08-09 MED FILL — OMNIPAQUE 180 MG/ML IJ SOLN: 180 MG/ML | INTRAMUSCULAR | Qty: 20

## 2016-08-09 NOTE — Progress Notes (Signed)
Discharge criteria met  Patient alert and oriented x3  Post procedure dressing dry and intact  Sensory and motor function intact as per pre-procedure  Instructions and follow up reviewed with pt at discharge  Patient discharged wheelchair @ [phone] with driver.  Boyfriend will care for her today  No scripts given  Will see patient in two weeks

## 2016-08-09 NOTE — Procedures (Signed)
Pre-Procedure Note    Patient Name: Lynn Russell   Date of Birth:01/29/1975  Room/Bed: Room/bed info not found  Medical Record Number: [id number]  Date: 08/09/2016       Indication:    1. Spondylosis of cervical region without myelopathy or radiculopathy    2. Arthropathy of cervical facet joint    3. Degenerative disc disease, cervical    4. Status post cervical spinal fusion        Consent: On file.    Vital Signs:   Vitals:    08/09/16 0854   BP: (!) 107/58   Pulse: 66   Resp: 12   SpO2: 97%       Past Medical History:   has a past medical history of Thyroid disease.    Past Surgical History:   has a past surgical history that includes back surgery (06/10/2014); Cesarean section (04/26/1996); hernia repair; and Tonsillectomy.    Pre-Sedation Documentation and Exam:   Vital signs have been reviewed (see flow sheet for vitals).     Mallampati Airway Assessment:  normal    ASA Classification:  Class 2 - A normal healthy patient with mild systemic disease    Sedation/ Anesthesia Plan:   intravenous sedation  as needed.    Medications Planned:   midazolam (Versed) / Fentanyl  Intravenously  as needed.    Patient is an appropriate candidate for plan of sedation: yes  Patient's History and Physical examination was reviewed and there is no change.  Electronically signed by Landis GandyKiran Chary Sherrilyn Nairn, MD on 08/09/2016 at 8:59 AM    Preoperative Diagnosis:    1.  Degenerative cervical disc disease.  2.  Cervical spondylosis.  3.  Facet joint arthropathy.    Postoperative Diagnosis:   1.  Degenerative cervical disc disease.  2.  Cervical spondylosis.  3.  Facet joint arthropathy.    Procedure Performed:  Cervical facet joint injections at the levels of C2-3, C3-4, C4-5, C5-6, C6-7, C7-T1 on the Right side with fluoroscopic guidance, with IV sedation    Indication for the Procedure:  The patient had a history of chronic cervical pain that is not responding well to the conservative treatment.  Patient's pain is mostly axial in nature.   Pain is interfering with the activities of daily living.  Physical examination revealed facet tenderness and facet loading is positive.  We decided to try cervical facet joint injection for diagnostic as well as for therapeutic purposes.  The procedure and its risks were discussed with the patient and an informed consent was obtained.  .Current Pain Assessment      Procedure:  After starting an IV, the patient was sedated with 2 mg of Midazolam and 0 mcg of Fentanyl intravenously by the RN under my direct supervision. Patient's vital signs including BP, EKG and SaO2 were monitored by RN and they remained stable during the procedure.  A meaningful communication was kept up with the patient throughout   the procedure.    The patient is placed in prone position.  Skin over the back was prepped and draped in sterile manner.  Under fluoroscopy the facet joints were identified and palpated, and the following joints were found to be tender:  C2-3, C3-4, C4-5, C5-6, C6-7, C7-T1 on Right side.  Hence we decided to inject these joints in the following way.    Using fluoroscopy the facet joints were identified and by adjusting angle of the fluoroscopy the view of the joint space was optimized.  The skin and deep tissues over the joint space were anesthetized with 2mL of 0.5% Marcaine. The #22-gauge, 3-1/2 inch spinal needle was introduced through the skin wheal under fluoroscopic guidance such that the tip of the needle lies in the joint space.  This was confirmed by injecting about 0.5 mL of Omnipaque-180 through the needle and observing the spread of the contrast along the joint space. Then after negative aspiration a mixture of 0.5% Marcaine with triamcinolone was injected into the joint space. This was done at the levels of C2-3, C3-4, C4-5, C5-6, C6-7, C7-T1 on the Right side.  A total of 80 milligrams of triamcinolone and 6 mL of 0.5% Marcaine was used and was divided in equal amounts among the joints.  After removing  the needles Band-Aids were placed over the needle insertion sites.    Patient's vital signs remained stable and tolerated the procedure well.  Patient was discharged home in stable condition and will be followed in the pain clinic in the next few weeks for further planning.  Electronically signed by Landis Gandy, MD on 08/09/2016 at 8:59 AM

## 2016-08-09 NOTE — Progress Notes (Deleted)
POST-PROCEDURE INSTRUCTIONS  Today's Procedure  Right Cervical C2-3 - C7-T1    If you are a diabetic, monitor your blood sugar closely for several days and contact your primary physician or the doctor that manages your diabetes if results are outside your normal range.    Restart blood thinner at next scheduled dose.  Call your ordering physician for when to have INR drawn. Remember to follow pre -procedure instructions previously given to you for any subsequent injections.     After your procedure, you can expect some soreness or aching from the needle placement.  You may also experience numbness at the injection site, or numbness radiating into your legs or arms (depending on procedure location).  This may last from 4 to 8 hours, however, you should have full movement and strength.  Until you have normal sensation, please use caution in climbing stairs, stepping up curbs, etc.    You initially may feel better since we injected a numbing medication along with the steroid.  It may, however, take 3 to 7 days to notice the effects of the steroid medication.    Medications Administered today :  Kenalog, sensoricaine omnipaque     Iv sedation Versed    The following information was given to and explained to the patient and/or family:   today's procedure, local anesthetic, IV sedation (patient instructed not to drive motor vehicles for 24 hours), and blood thinner risks/ please do not consume any alcoholic beverages today. Please do not make any important decisions today if you had sedation.    PAIN MEDICATION:  For minor discomfort, over the counter pain relievers like Advil (ibuprofen) or Tylenol (acetaminophen) may be used.  Do not to exceed label instructions.  If you are pregnant, lactating, have kidney or liver disorders, please check with your doctor before using over the counter medications.  Medication prescribed by your physician may be taken  as directed for discomfort not relieved by non-prescription  medication.    ACTIVITY:  You may be up and around as desired and as tolerated by your level of comfort.  Feel free to use an ice pack, or heating pad intermittently as necessary.    DIET:  You may resume regular eating habits.    OBSERVE FOR:  Although extremely unusual, you should be alert to and report any signs of infection.  Symptoms to be aware of include: redness and/or warmth around the needle puncture site, increased pain other than expected from the procedure, swelling, drainage, chills, night sweats, and fever above 100 degrees F. (38 degrees C)    If you have any subsequent procedures scheduled, follow the pre procedure instructions given to you at your previous visit.     If you have any questions or concerns, please call our office at [phone]. You may be asked to leave a message; this will allow time for the nurses to gather information from your chart and ask the doctor any questions regarding your care.    Please contact our office with any questions or to report any problems you may be experiencing.  If we are not available when you call, your call will be returned at our first opportunity.  Chronic pain is not an emergency.  If you feel you need to schedule an emergency evaluation regarding care you received at the pain clinic, please contact our clinic at [phone].  If you have an emergency, chest pain, shortness of breath or anything you feel is an emergent situation,  go the  nearest Emergency Room.    Please call  [phone] for after hour assistance.  Monday - Friday after 5 pm.   Saturday and Sunday all day.        SIDE EFFECTS FROM STEROID USE      After having an injection using steroids, you may experience some side effects.  These side effects are temporary and are self-resolving after the body absorbs the steroid in approximately 3-7 days.  Some side effects include:        - Insomnia    - Acid stomach    - Increased Blood Sugars (Monitor closely if you are Diabetic)    -  Flushed face    - Fatigue, muscle/joint aches    - Headaches    - Water retention    - Increased appetite      It may take up to 1 week for the full benefit of the steroid to be realized      As established several months ago, it is now a necessary requirement to bring ALL MEDICATIONS prescribed by this office to each visit, in the original bottles ~ this includes Non-Narcotic medications. Failure to do so will prevent current prescriptions from being released.             As stated in the medication agreement, a monthly appointment is required for any patient receiving medications, if you cancel an appointment your medications will not be dispensed until you are able to be seen. This may result in you being without medication for a period of time.     To avoid any interruption in your medication schedule, remember to bring ALL MEDICATIONS prescribed by this office TO EACH VISIT IN THE ORIGINAL BOTTLES AND KEEP ALL SCHEDULED APPOINTMENTS.

## 2016-08-09 NOTE — Discharge Instructions (Signed)
POST-PROCEDURE INSTRUCTIONS  Today's Procedure  Right Cervical facet C2-3-C7-T1    If you are a diabetic, monitor your blood sugar closely for several days and contact your primary physician or the doctor that manages your diabetes if results are outside your normal range.    Restart blood thinner at next scheduled dose.  Call your ordering physician for when to have INR drawn. Remember to follow pre -procedure instructions previously given to you for any subsequent injections.     After your procedure, you can expect some soreness or aching from the needle placement.  You may also experience numbness at the injection site, or numbness radiating into your legs or arms (depending on procedure location).  This may last from 4 to 8 hours, however, you should have full movement and strength.  Until you have normal sensation, please use caution in climbing stairs, stepping up curbs, etc.    You initially may feel better since we injected a numbing medication along with the steroid.  It may, however, take 3 to 7 days to notice the effects of the steroid medication.    Medications Administered today :  IV sedation Versed,  Kenalog, sensorcaine, omnipaque    The following information was given to and explained to the patient and/or family:   today's procedure, local anesthetic, IV sedation (patient instructed not to drive motor vehicles for 24 hours), and blood thinner risks/ please do not consume any alcoholic beverages today. Please do not make any important decisions today if you had sedation.    PAIN MEDICATION:  For minor discomfort, over the counter pain relievers like Advil (ibuprofen) or Tylenol (acetaminophen) may be used.  Do not to exceed label instructions.  If you are pregnant, lactating, have kidney or liver disorders, please check with your doctor before using over the counter medications.  Medication prescribed by your physician may be taken  as directed for discomfort not relieved by  non-prescription medication.    ACTIVITY:  You may be up and around as desired and as tolerated by your level of comfort.  Feel free to use an ice pack, or heating pad intermittently as necessary.    DIET:  You may resume regular eating habits.    OBSERVE FOR:  Although extremely unusual, you should be alert to and report any signs of infection.  Symptoms to be aware of include: redness and/or warmth around the needle puncture site, increased pain other than expected from the procedure, swelling, drainage, chills, night sweats, and fever above 100 degrees F. (38 degrees C)    If you have any subsequent procedures scheduled, follow the pre procedure instructions given to you at your previous visit.     If you have any questions or concerns, please call our office at [phone]. You may be asked to leave a message; this will allow time for the nurses to gather information from your chart and ask the doctor any questions regarding your care.    Please contact our office with any questions or to report any problems you may be experiencing.  If we are not available when you call, your call will be returned at our first opportunity.  Chronic pain is not an emergency.  If you feel you need to schedule an emergency evaluation regarding care you received at the pain clinic, please contact our clinic at [phone].  If you have an emergency, chest pain, shortness of breath or anything you feel is an emergent situation,  go the nearest Emergency Room.  Please call  [phone] for after hour assistance.  Monday - Friday after 5 pm.   Saturday and Sunday all day.        SIDE EFFECTS FROM STEROID USE      After having an injection using steroids, you may experience some side effects.  These side effects are temporary and are self-resolving after the body absorbs the steroid in approximately 3-7 days.  Some side effects include:        - Insomnia    - Acid stomach    - Increased Blood Sugars (Monitor closely if you are  Diabetic)    - Flushed face    - Fatigue, muscle/joint aches    - Headaches    - Water retention    - Increased appetite      It may take up to 1 week for the full benefit of the steroid to be realized      As established several months ago, it is now a necessary requirement to bring ALL MEDICATIONS prescribed by this office to each visit, in the original bottles ~ this includes Non-Narcotic medications. Failure to do so will prevent current prescriptions from being released.             As stated in the medication agreement, a monthly appointment is required for any patient receiving medications, if you cancel an appointment your medications will not be dispensed until you are able to be seen. This may result in you being without medication for a period of time.     To avoid any interruption in your medication schedule, remember to bring ALL MEDICATIONS prescribed by this office TO EACH VISIT IN THE ORIGINAL BOTTLES AND KEEP ALL SCHEDULED APPOINTMENTS.

## 2016-08-10 NOTE — Telephone Encounter (Signed)
No answer x 2

## 2016-08-23 ENCOUNTER — Inpatient Hospital Stay: Admit: 2016-08-23 | Payer: PRIVATE HEALTH INSURANCE | Attending: Anesthesiology | Primary: Family Medicine

## 2016-08-23 DIAGNOSIS — M47892 Other spondylosis, cervical region: Secondary | ICD-10-CM

## 2016-08-23 NOTE — Progress Notes (Addendum)
[facility]  Patient Pain Assessment  RECHECK - Dr. Jamesetta So    Primary Care Physician: Tennis Must, PA-C    Chief complaint:   Chief Complaint   Patient presents with   ??? Neck Pain   .    HISTORY OF PRESENT ILLNESS:  L[facility] is 41 y.o. female with    Patient return to the pain clinic with a chief complaint of pain involving the cervical area he had undergone cervical facet joint injection on 08/09/2016 with about 80% improvement in the pain. She reports she is able to move her neck with less pain and able to increase her activity levels. She reports she is trying to do her stretches and exercises. Overall she is feeling better. Overall she is feeling better      Neck Pain    This is a chronic problem. The current episode started more than 1 year ago. The problem occurs intermittently. The problem has been gradually improving. The pain is present in the midline. The quality of the pain is described as burning (Heavy). The pain is at a severity of 3/10. The pain is mild. The symptoms are aggravated by twisting (Neck movements). Stiffness is present in the morning. Associated symptoms include headaches and tingling. Pertinent negatives include no chest pain, fever, numbness, photophobia, weakness or weight loss.       OARRS compliant? not applicable  Concern for prescription abuse?not applicable    Current Pain Assessment  Pain Assessment  Pain Assessment: 0-10  Pain Level: 3  Pain Type: Chronic pain  Pain Location: Neck, Arm  Pain Orientation: Right, Left, Upper  Pain Radiating Towards: down both arms  Pain Descriptors: Constant, Burning, Heaviness (pinching)  Pain Frequency: Continuous  Pain Onset: On-going  Clinical Progression: Gradually improving  Effect of Pain on Daily Activities: More difficult to sit or walk beyond 15-20 minutes  Patient's Stated Pain Goal: 2 (Decrease pain and increase activity)  Pain Intervention(s): Medication (see eMar), Cold applied,  Rest, Repositioned              GENERAL PHYSICAL EXAM:  Vitals: BP 116/73    Pulse 80    Temp 98.7 ??F (37.1 ??C) (Oral)    Resp 16    Ht 5\' 3"  (1.6 m)    Wt 199 lb (90.3 kg)    SpO2 95%    BMI 35.25 kg/m?? , Body mass index is 35.25 kg/m??.  Physical Exam   Constitutional: She is oriented to person, place, and time. She appears well-developed and well-nourished.   HENT:   Head: Normocephalic and atraumatic.   Eyes: Conjunctivae are normal. Pupils are equal, round, and reactive to light.   Neck: Neck supple. No tracheal deviation present. No thyromegaly present.   Cardiovascular: Normal rate and regular rhythm.    Pulmonary/Chest: Effort normal. No respiratory distress.   Abdominal: She exhibits no distension.   Musculoskeletal: Normal range of motion.   Neurological: She is alert and oriented to person, place, and time. She has normal strength. She displays no atrophy, no tremor and normal reflexes. No cranial nerve deficit or sensory deficit. She exhibits normal muscle tone. She displays a negative Romberg sign. Coordination normal.   Reflex Scores:       Tricep reflexes are 2+ on the right side and 2+ on the left side.       Bicep reflexes are 2+ on the right side and 2+ on the left side.  Brachioradialis reflexes are 2+ on the right side and 2+ on the left side.  Skin: Skin is warm and dry.        Psychiatric: She has a normal mood and affect. Her speech is normal and behavior is normal. Judgment and thought content normal. Cognition and memory are normal.    Back Exam     Tenderness   The patient is experiencing tenderness in the cervical.    Range of Motion   Back extension: Minimally painful.   Flexion: normal   Back lateral bend right: Minimally painful.   Back lateral bend left: Minimally painful.   Back rotation right: Minimally painful.   Back rotation left: Minimally painful.     Comments:  There is mild decrease in the cervical lordosis. There is a well-healed scar anteriorly without any signs of  infection or inflammation. Examination of the motor system and sensory system in the upper extremities did not reveal any deficits reflexes are about 2 to plus at biceps and triceps and brachioradialis bilaterally            Nurses Notes and Vital Signs reviewed.       Imaging:  Radiology Images and Reports reviewed where indicated and necessary    Patient Active Problem List   Diagnosis   ??? Spondylosis of cervical region without myelopathy or radiculopathy        ASSESSMENT    L[facility] is a 41 y.o. female with     1. Spondylosis of cervical region without myelopathy or radiculopathy    2. Arthropathy of cervical facet joint    3. Degenerative disc disease, cervical    4. Status post cervical spinal fusion           PLAN  Patient is doing well with good pain relief after the  last procedure. Patient is able to increase the activity levels. Patient does not complain much pain and is overall satisfied with the pain relief. As patient is doing well at this time no further interventions are needed.  Counselling/Preventive measures for pain  Control:    [x]   Spine strengthening exercises are discussed with patient in detail.     Patient is encouraged to exercise and counseled extensively on importance of exercise and strengthening the muscles to help the pain.   Patient is counseled on importance of exercise and,core strengthening.  Some  specific exercises to strengthen the abdominal muscles and low back muscles Were discussed.Also aquatic (water) physical therapy and benefits were explained to patient.including " Water supports the body and minimizes the effect of gravity, making it easier for patients to start an exercise program."   The following important principles were discussed with patient:  1. Limit Bed Rest--Studies show that people with short-term low-back pain who rest feel more pain and have a harder time with daily tasks than those who stay active.  2. Keep Exercising-patient is advised to stay away  from strenuous activities like gardening and avoid whatever motion caused the pain in the first place.???  3. Maintain Good Posture-Exercises  to maintain good posture were shown to patient.  4. To do exercises learned in PT regularly.  [] Information (handout) on exercise was  given to patient.  [x]   Patient is counseled about  ill effects of smoking for 5 minutes -Patient is strongly urged to quit smoking   Following health benefits were discussed:  People who stop smoking greatly reduce their risk for disease and premature death. Lowered risk for  lung cancer and many other types of cancer.  Reduced risk for coronary heart disease, stroke, and peripheral vascular disease.  Reduced coronary heart disease risk within 1 to 2 years of quitting.  Reduced respiratory symptoms, such as coughing, wheezing, and shortness of breath. The rate of decline in lung function is slower among people who quit smoking than among those who continue to smoke.  Reduced risk of developing chronic obstructive pulmonary disease (COPD), one of the leading causes of death in the [place].  Reduced risk for infertility in women of reproductive age. Women who stop smoking during pregnancy also reduce their risk of having a low birth weight baby.  Reduced risk of bone and joint damage.    Methods to Quit Smoking  The majority of cigarette smokers quit without using evidence-based   Counseling and medication are both effective for treating tobacco dependence, and using them together is more effective than using either one alone.Patient is advised to follow up with his physician for further management.     [] Information hand out was given to patient-along with web site for further information-Patient is advised to follow up with primary care physician for further follow up and further management        Decision Making Process : Patient's health history and referral records thoroughly reviewed before focused physical examination and discussion  with patient.   Over 50% of today's visit is spent on examining the patient and counseling.     Level of complexity of date to be reviewed is Moderate. The chart date reviewed include the following: Imaging Reports. Summary of Care.     Time spent reviewing with patient the below reports:   Medication safety, Treatment options.    Level of diagnosis and management options of this case is multiple: involving the following management options: Interventions as needed, medication management as appropriate, future visits, activity modification, heat/ice as needed, Urine drug screen as required.     [x] The patient's questions were answered to the best of my abilities.    This note was created using voice recognition software. There may be inaccuracies of transcription  that are inadvertently overlooked prior to the signature.  There is any questions about the transcription please contact me.  At this time as the patient is doing well we will see the patient as needed. Patient is advised to contact the pain clinic if the pain returns are if our services are needed again    Electronically signed by Landis GandyKiran Chary Marjie Chea, MD on 08/23/2016 at 2:19 PM           Electronically signed by Landis GandyKiran Chary Larya Charpentier, MD on 08/23/2016 at 2:19 PM               ADVERSE MEDICATION EFFECTS:   Constipation: no  Bowel Regimen: No:   Diet: common adult  Appetite:  ok  Sedation:  no  Urinary Retention: no    FOCUSED PAIN SCALE:  Highest : 5  Lowest :1  Average: Range-3  When and What  was your last procedure:  08/09/16 Right cervical facet    Was your procedure effective:  Yes 85%    ACTIVITY/SOCIAL/EMOTIONAL:  Sleep Pattern: 5 hours per night. generally restful sleep  Energy Level:  Tired/Fatigued  Currently attending Physical Therapy:  No  Home Exercises: daily stretches  Mobility: Walk for only 15-20 minutes then has increased pain  Currently seeing a Psychiatrist or Psychologist:  No  Emotional Issues: normal   Mood: appropriate  ABERRANT  BEHAVIORS SINCE LAST VISIT:  Have you ever been treated in another Pain Clinic not applicable  Refills for prescriptions appropriate: not applicable  Lost rx/pills: not applicable  Taking more medication than prescribed:  not applicable  Are you receiving PAIN medications from  other doctors: not applicable  Last Urine/Serum Drug Screen :  Was Serum/UDS as anticipated?  not applicable  Brought pill bottles in :not applicable   Was Pill count appropriate? :not applicable   Are currently pregnant? no  Recent ER visits: No             Past Medical History      Diagnosis Date   ??? Thyroid disease        Surgical History  Past Surgical History:   Procedure Laterality Date   ??? BACK SURGERY  06/10/2014    neck   ??? CESAREAN SECTION  04/26/1996   ??? HERNIA REPAIR     ??? TONSILLECTOMY         Medications  Current Outpatient Prescriptions   Medication Sig Dispense Refill   ??? pregabalin (LYRICA) 150 MG capsule Take 150 mg by mouth 2 times daily.Marland Kitchen     ??? Levothyroxine Sodium 100 MCG CAPS Take 100 mcg by mouth 2 times daily     ??? citalopram (CELEXA) 40 MG tablet Take 40 mg by mouth daily     ??? buPROPion (WELLBUTRIN XL) 300 MG extended release tablet Take 300 mg by mouth every morning     ??? cyclobenzaprine (FLEXERIL) 10 MG tablet Take 10 mg by mouth nightly Takes 2 tablets at night     ??? medical marijuana Take by mouth as needed     ??? traMADol (ULTRAM) 50 MG tablet Take 50 mg by mouth nightly.Marland Kitchen     ??? clonazePAM (KLONOPIN) 0.5 MG tablet Take 0.5 mg by mouth daily as needed..       No current facility-administered medications for this encounter.        Allergies  Doxycycline and Norco [hydrocodone-acetaminophen]    Family History  family history includes Cancer in her father; Mult Sclerosis in her mother.    Social History  Social History     Social History   ??? Marital status: Single     Spouse name: N/A   ??? Number of children: N/A   ??? Years of education: N/A     Social History Main Topics   ??? Smoking status: Current Every Day Smoker      Packs/day: 0.50     Years: 20.00   ??? Smokeless tobacco: Never Used   ??? Alcohol use None      Comment: socially   ??? Drug use: Yes     Types: Marijuana      Comment: Medical marijuana from [place]   ??? Sexual activity: Not Asked     Other Topics Concern   ??? None     Social History Narrative   ??? None      reports that she uses drugs, including Marijuana.        REVIEW OF SYSTEMS:  Review of Systems   Constitutional: Negative for fever and weight loss.   HENT: Negative.  Negative for congestion and hearing loss.    Eyes: Negative.  Negative for blurred vision, double vision and photophobia.   Respiratory: Negative.  Negative for cough and shortness of breath.    Cardiovascular: Negative for chest pain, palpitations and leg swelling.   Gastrointestinal: Negative.  Negative for constipation, heartburn  and nausea.   Genitourinary: Negative.  Negative for dysuria.   Musculoskeletal: Positive for neck pain.   Skin: Negative.  Negative for rash.   Neurological: Positive for tingling and headaches. Negative for weakness and numbness.   Endo/Heme/Allergies: Negative.    Psychiatric/Behavioral: Positive for depression. Negative for substance abuse and suicidal ideas.     . Added  nurses notes was reviewed and collaborated with the patient.  Electronically signed by Landis Gandy, MD on 08/23/2016 at 4:36 PM

## 2016-08-23 NOTE — Discharge Instructions (Signed)
See patient as needed.

## 2016-11-15 ENCOUNTER — Ambulatory Visit: Admit: 2016-11-15 | Payer: PRIVATE HEALTH INSURANCE | Primary: Family Medicine

## 2016-11-15 ENCOUNTER — Encounter
Admit: 2016-11-15 | Discharge: 2016-11-15 | Payer: PRIVATE HEALTH INSURANCE | Attending: Family | Primary: Family Medicine

## 2016-11-15 ENCOUNTER — Encounter

## 2016-11-15 DIAGNOSIS — M21611 Bunion of right foot: Secondary | ICD-10-CM

## 2017-01-11 ENCOUNTER — Encounter

## 2017-01-12 ENCOUNTER — Ambulatory Visit
Admit: 2017-01-12 | Discharge: 2017-01-12 | Payer: PRIVATE HEALTH INSURANCE | Attending: Student in an Organized Health Care Education/Training Program | Primary: Family Medicine

## 2017-01-12 DIAGNOSIS — M25561 Pain in right knee: Secondary | ICD-10-CM
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
## 2017-02-11 ENCOUNTER — Inpatient Hospital Stay
Admit: 2017-02-11 | Discharge: 2017-02-15 | Payer: PRIVATE HEALTH INSURANCE | Attending: Family | Primary: Family Medicine

## 2017-02-11 DIAGNOSIS — M5020 Other cervical disc displacement, unspecified cervical region: Secondary | ICD-10-CM

## 2017-02-11 NOTE — Discharge Instructions (Signed)
PAIN CARE CLINIC  PRE-PROCEDURE INSTRUCTIONS    PROCEDURE __RIGHT CERVICAL FACET INJECTION c2 THRU T1______________________________________X ______  1.) ON ________________________ ARRIVE _________________  2.) ON ________________________ ARRIVE __________________  3.) ON ________________________ ARRIVE __________________    FOLLOW UP ________________________ ARRIVE ___________________  1. Shower or bathe day of procedure and wear loose fitting clothing.    2. Do not eat or drink anything for _____ hours prior to procedure.    3. Unless otherwise instructed, take medications with a sip of water                                                                        4. If you are a diabetic, check your blood sugar prior to arrival, do not  take diabetic medications but please bring them with you.    5. You must be off the following blood thinners prior to procedure date: Please do not hold any blood thinners until we have directed you to do so.  a. Plavix  7 days ___ Coumadin  5 days ___  Platel  7 days___  b. Adult aspirin 325 mg for 14 days ___  c. Baby aspirin 81 mg for 7 days ___  d. Any anti-inflammatory medications like Ibuprofen, Aleve, Voltaren, and Mobic for 7 days ___  e. Other blood thinner____________    6. You must be in the lab for pre-procedure lab work on _______ at______    7. Please bring order slip  with you.    8. You must arrange for someone to drive you home after your procedure. The procedure will not be performed unless this arrangement is made.    9. Additional instructions_____________________________________ __  10.   If any problems call [phone], after hours call [phone] and request Dr Nat ChristenK Tamirisa be paged through the answering service.    Patient Signature _______________________________________ Date     Pain Clinic Nurse _______________________________________  preprocedurae
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
## 2017-02-25 ENCOUNTER — Inpatient Hospital Stay: Admit: 2017-02-25 | Payer: PRIVATE HEALTH INSURANCE | Primary: Family Medicine

## 2017-02-25 ENCOUNTER — Inpatient Hospital Stay: Admit: 2017-02-25 | Payer: PRIVATE HEALTH INSURANCE | Attending: Anesthesiology | Primary: Family Medicine

## 2017-02-25 DIAGNOSIS — M47812 Spondylosis without myelopathy or radiculopathy, cervical region: Secondary | ICD-10-CM

## 2017-02-25 MED ORDER — MIDAZOLAM HCL 2 MG/2ML IJ SOLN
2 MG/ML | Freq: Once | INTRAMUSCULAR | Status: AC | PRN
Start: 2017-02-25 — End: 2017-02-25
  Administered 2017-02-25: 14:00:00 2 via INTRAVENOUS

## 2017-02-25 MED FILL — OMNIPAQUE 180 MG/ML IJ SOLN: 180 mg/mL | INTRAMUSCULAR | Qty: 20

## 2017-02-25 MED FILL — KENALOG 40 MG/ML IJ SUSP: 40 mg/mL | INTRAMUSCULAR | Qty: 2

## 2017-02-25 MED FILL — BUPIVACAINE HCL (PF) 0.5 % IJ SOLN: 0.5 % | INTRAMUSCULAR | Qty: 30

## 2017-02-25 NOTE — Discharge Instructions (Signed)
POST-PROCEDURE INSTRUCTIONS  Today's Procedure                                                                                                    Right Cervical facet joint injection C2-3 to C7-T1     If you are a diabetic, monitor your blood sugar closely for several days and contact your primary physician or the doctor that manages your diabetes if results are outside your normal range.    Restart blood thinner at next scheduled dose.  Call your ordering physician for when to have INR drawn. Remember to follow pre -procedure instructions previously given to you for any subsequent injections.     After your procedure, you can expect some soreness or aching from the needle placement.  You may also experience numbness at the injection site, or numbness radiating into your legs or arms (depending on procedure location).  This may last from 4 to 8 hours, however, you should have full movement and strength.  Until you have normal sensation, please use caution in climbing stairs, stepping up curbs, etc.    You initially may feel better since we injected a numbing medication along with the steroid.  It may, however, take 3 to 7 days to notice the effects of the steroid medication.    Medications Administered today :  Kenalog, Sensorcaine, Omnipaque, Versed     The following information was given to and explained to the patient and/or family:   today's procedure, local anesthetic, IV sedation (patient instructed not to drive motor vehicles for 24 hours), and blood thinner risks/ please do not consume any alcoholic beverages today. Please do not make any important decisions today if you had sedation.    PAIN MEDICATION:  For minor discomfort, over the counter pain relievers like Advil (ibuprofen) or Tylenol (acetaminophen) may be used.  Do not to exceed label instructions.  If you are pregnant, lactating, have kidney or liver disorders, please check with your doctor before using over the counter medications.  Medication  prescribed by your physician may be taken  as directed for discomfort not relieved by non-prescription medication.    ACTIVITY:  You may be up and around as desired and as tolerated by your level of comfort.  Feel free to use an ice pack, or heating pad intermittently as necessary.    DIET:  You may resume regular eating habits.    OBSERVE FOR:  Although extremely unusual, you should be alert to and report any signs of infection.  Symptoms to be aware of include: redness and/or warmth around the needle puncture site, increased pain other than expected from the procedure, swelling, drainage, chills, night sweats, and fever above 100 degrees F. (38 degrees C)    If you have any subsequent procedures scheduled, follow the pre procedure instructions given to you at your previous visit.     If you have any questions or concerns, please call our office at [phone]. You may be asked to leave a message; this will allow time for the nurses to gather information from your chart and ask the  doctor any questions regarding your care.    Please contact our office with any questions or to report any problems you may be experiencing.  If we are not available when you call, your call will be returned at our first opportunity.  Chronic pain is not an emergency.  If you feel you need to schedule an emergency evaluation regarding care you received at the pain clinic, please contact our clinic at [phone].  If you have an emergency, chest pain, shortness of breath or anything you feel is an emergent situation,  go the nearest Emergency Room.    Please call  [phone] for after hour assistance.  Monday - Friday after 5 pm.   Saturday and Sunday all day.        SIDE EFFECTS FROM STEROID USE      After having an injection using steroids, you may experience some side effects.  These side effects are temporary and are self-resolving after the body absorbs the steroid in approximately 3-7 days.  Some side effects  include:        - Insomnia    - Acid stomach    - Increased Blood Sugars (Monitor closely if you are Diabetic)    - Flushed face    - Fatigue, muscle/joint aches    - Headaches    - Water retention    - Increased appetite      It may take up to 1 week for the full benefit of the steroid to be realized      As established several months ago, it is now a necessary requirement to bring ALL MEDICATIONS prescribed by this office to each visit, in the original bottles ~ this includes Non-Narcotic medications. Failure to do so will prevent current prescriptions from being released.             As stated in the medication agreement, a monthly appointment is required for any patient receiving medications, if you cancel an appointment your medications will not be dispensed until you are able to be seen. This may result in you being without medication for a period of time.     To avoid any interruption in your medication schedule, remember to bring ALL MEDICATIONS prescribed by this office TO EACH VISIT IN THE ORIGINAL BOTTLES AND KEEP ALL SCHEDULED APPOINTMENTS.

## 2017-02-25 NOTE — Procedures (Signed)
Pre-Procedure Note    Patient Name: Lynn Russell   Date of Birth:[birth year]  Room/Bed: Room/bed info not found  Medical Record Number: [id number]  Date: 02/25/2017       Indication:    1. Spondylosis of cervical region without myelopathy or radiculopathy    2. Arthropathy of cervical facet joint    3. Degenerative disc disease, cervical    4. Status post cervical spinal fusion        Consent: On file.    Vital Signs:   Vitals:    02/25/17 0950   BP: 119/84   Pulse: 72   Resp: 18   Temp:    SpO2: 99%       Past Medical History:   has a past medical history of Thyroid disease.    Past Surgical History:   has a past surgical history that includes back surgery (06/10/2014); Cesarean section (04/26/1996); hernia repair; and Tonsillectomy.    Pre-Sedation Documentation and Exam:   Vital signs have been reviewed (see flow sheet for vitals).     Mallampati Airway Assessment:  normal    ASA Classification:  Class 2 - A normal healthy patient with mild systemic disease    Sedation/ Anesthesia Plan:   intravenous sedation  as needed.    Medications Planned:   midazolam (Versed) / Fentanyl  Intravenously  as needed.    Patient is an appropriate candidate for plan of sedation: yes  Patient's History and Physical examination was reviewed and there is no change.  Electronically signed by Landis Gandy, MD on 02/25/2017 at 10:23 AM    Preoperative Diagnosis:    1.  Degenerative cervical disc disease.  2.  Cervical spondylosis.  3.  Facet joint arthropathy.    Postoperative Diagnosis:   1.  Degenerative cervical disc disease.  2.  Cervical spondylosis.  3.  Facet joint arthropathy.    Procedure Performed:  Cervical facet joint injections at the levels of C2-3, C3-4, C4-5, C5-6, C6-7, C7-T1 on the Right side with fluoroscopic guidance, with IV sedation    Indication for the Procedure:  The patient had a history of chronic cervical pain that is not responding well to the conservative treatment.  Patient's pain is mostly axial in  nature.  Pain is interfering with the activities of daily living.  Physical examination revealed facet tenderness and facet loading is positive.  We decided to try cervical facet joint injection for diagnostic as well as for therapeutic purposes.  The procedure and its risks were discussed with the patient and an informed consent was obtained.  .Current Pain Assessment  Pain Assessment  Pain Assessment: 0-10  Pain Level: 3  Patient's Stated Pain Goal:  (pain at 2 and increase activity)  Pain Type: Chronic pain  Pain Location: Neck  Pain Orientation: Right, Left (worse on right)  Pain Radiating Towards: across shoulders, right side of head  Pain Descriptors: Burning, Heaviness  Pain Frequency: Continuous  Pain Onset: On-going  Clinical Progression: Gradually worsening  Effect of Pain on Daily Activities: pain increases with head movement  Non-Pharmaceutical Pain Intervention(s): Rest, Heat applied  POSS Score (Patient Ctrl Analgesia): 1   Procedure:  After starting an IV, the patient was sedated with 2 mg of Midazolam and 0 mcg of Fentanyl intravenously by the RN under my direct supervision. Patient's vital signs including BP, EKG and SaO2 were monitored by RN and they remained stable during the procedure.  A meaningful communication was kept up with the patient  throughout   the procedure.    The patient is placed in prone position.  Skin over the back was prepped and draped in sterile manner.  Under fluoroscopy the facet joints were identified and palpated, and the following joints were found to be tender:  C2-3, C3-4, C4-5, C5-6, C6-7, C7-T1 on Right side.  Hence we decided to inject these joints in the following way.    Using fluoroscopy the facet joints were identified and by adjusting angle of the fluoroscopy the view of the joint space was optimized.  The skin and deep tissues over the joint space were anesthetized with 2mL of 0.5% Marcaine. The #22-gauge, 3-1/2 inch spinal needle was introduced through the  skin wheal under fluoroscopic guidance such that the tip of the needle lies in the joint space.  This was confirmed by injecting about 0.5 mL of Omnipaque-180 through the needle and observing the spread of the contrast along the joint space. Then after negative aspiration a mixture of 0.5% Marcaine with triamcinolone was injected into the joint space. This was done at the levels of C2-3, C3-4, C4-5, C5-6, C6-7, C7-T1 on the Right side.  A total of 80 milligrams of triamcinolone and 6 mL of 0.5% Marcaine was used and was divided in equal amounts among the joints.  After removing the needles Band-Aids were placed over the needle insertion sites.    Patient's vital signs remained stable and tolerated the procedure well.  Patient was discharged home in stable condition and will be followed in the pain clinic in the next few weeks for further planning.  Electronically signed by Landis GandyKiran Chary Smiley Birr, MD on 02/25/2017 at 10:23 AM

## 2017-02-25 NOTE — Progress Notes (Signed)
Discharge criteria met.  Patient alert and oriented x3  Post procedure dressing dry and intact.  Sensory and motor function intact as per pre-procedure.  Instructions and follow up reviewed with pt at patient at discharge.  Patient discharged by wheelchair with daughter and volunteer @ 10:10am.  Steady gait. Home and drive by daughter.

## 2017-02-28 NOTE — Telephone Encounter (Signed)
No answer no message

## 2017-03-10 ENCOUNTER — Inpatient Hospital Stay: Admit: 2017-03-10 | Payer: PRIVATE HEALTH INSURANCE | Attending: Anesthesiology | Primary: Family Medicine

## 2017-03-10 DIAGNOSIS — M47812 Spondylosis without myelopathy or radiculopathy, cervical region: Secondary | ICD-10-CM

## 2017-03-10 NOTE — Progress Notes (Signed)
[facility]  Patient Pain Assessment  RECHECK - Dr. Jamesetta So    Primary Care Physician: Alvan Dame, MD    Chief complaint:   Chief Complaint   Patient presents with   . Neck Pain   .    HISTORY OF PRESENT ILLNESS:  Lynn Russell is 42 y.o. female with    Patient return to the pain clinic with a chief complaint of pain involving the cervical region with associated headaches. This patient had undergone cervical facet joint injections on 02/25/2017 with about 40-50% improvement in the neck pain and headaches. She reports she is able to increase the range of neck movements. Overall at present she is satisfied with the pain relief she is having. He also reports she is doing her stretches and exercises. She continues to smoke      Neck Pain    This is a chronic problem. The current episode started more than 1 year ago. The problem occurs intermittently. The problem has been gradually improving. The pain is present in the midline. The quality of the pain is described as burning (Heavy). The pain is at a severity of 4/10 (4-5). The pain is mild. The symptoms are aggravated by twisting (Neck movements). Stiffness is present in the morning. Pertinent negatives include no chest pain, fever, numbness, photophobia, weakness or weight loss. Treatments tried: Cervical facet joint injections. The treatment provided moderate relief.       RX Monitoring 02/11/2017   Attestation The Prescription Monitoring Report for this patient was reviewed today.   Documentation Possible medication side effects, risk of tolerance/dependence & alternative treatments discussed.;Obtaining appropriate analgesic effect of treatment.;No signs of potential drug abuse or diversion identified.   Medication Contracts Existing medication contract.       Current Pain Assessment  Pain Assessment  Pain Assessment: 0-10  Pain Level: 3  Pain Type: Chronic pain  Pain Location: Shoulder, Neck  Pain Orientation: Right  Pain  Descriptors: Aching, Dull  Pain Frequency: Continuous  Clinical Progression: Gradually improving  Non-Pharmaceutical Pain Intervention(s): Rest                    ADVERSE MEDICATION EFFECTS:   Constipation: no  Bowel Regimen: No:   Diet: common adult  Appetite:  ok  Sedation:  no  Urinary Retention: no    FOCUSED PAINSCALE:  Highest : 5  Lowest :3  Average: Range-4  When and What  was your last procedure:   02-25-17  Cervical Facet jOint Injection   Was your procedure effective:  Yes, 40-50% improvement     ACTIVITY/SOCIAL/EMOTIONAL:  Sleep Pattern: 6 hours per night. nightime awakenings  Energy Level:  Tired/Fatigued  Currently attending Physical Therapy:  Yes  Home Exercises: three times a week walking and daily stretches  Mobility: no current mobility problem   Currently seeing a Psychiatrist or Psychologist:  Yes  Emotional Issues: normal   Mood: appropriate     ABERRANT BEHAVIORS SINCE LAST VISIT:  Have you ever been treated in another Pain Clinic yes  Refills for prescriptions appropriate: not applicable  Lost rx/pills:not applicable  Taking more medication than prescribed:  not applicable  Are you receiving PAIN medications from  other doctors: not applicable  Last Urine/Serum Drug Screen :  Was Serum/UDS as anticipated?  not applicable  Brought pill bottles in :not applicable   Was Pill count appropriate? :not applicable   Are currently pregnant?no  Recent ER visits: No  Past Medical History      Diagnosis Date   . Thyroid disease        Surgical History  Past Surgical History:   Procedure Laterality Date   . BACK SURGERY  06/10/2014    neck   . CESAREAN SECTION  04/26/1996   . HERNIA REPAIR     . TONSILLECTOMY         Medications  Current Outpatient Prescriptions   Medication Sig Dispense Refill   . Calcium Carb-Cholecalciferol (CALCIUM-VITAMIN D) 500-200 MG-UNIT per tablet Take 1 tablet by mouth 2 times daily (with meals)     . pregabalin (LYRICA) 150 MG capsule Take 150 mg by mouth 2 times  daily..     . citalopram (CELEXA) 40 MG tablet Take 40 mg by mouth daily     . cyclobenzaprine (FLEXERIL) 10 MG tablet Take 10 mg by mouth nightly Takes 2 tablets at night     . clonazePAM (KLONOPIN) 0.5 MG tablet as needed.       Marland Kitchen levothyroxine (SYNTHROID) 100 MCG tablet        No current facility-administered medications for this encounter.        Allergies  Doxycycline and Hydrocodone-acetaminophen    Family History  family history includes Cancer in her father; Mult Sclerosis in her mother.    Social History  Social History     Social History   . Marital status: Single     Spouse name: N/A   . Number of children: N/A   . Years of education: N/A     Social History Main Topics   . Smoking status: Current Every Day Smoker     Packs/day: 0.50     Years: 20.00   . Smokeless tobacco: Never Used   . Alcohol use No   . Drug use: Yes     Types: Marijuana      Comment: Medical marijuana from [place]   . Sexual activity: Not Asked     Other Topics Concern   . None     Social History Narrative    ** Merged History Encounter **           reports that she uses drugs, including Marijuana.        REVIEW OF SYSTEMS:  Review of Systems   Constitutional: Negative.  Negative for appetite change, fever and weight loss.   HENT: Negative.  Negative for congestion and hearing loss.    Eyes: Negative.  Negative for photophobia.   Respiratory: Negative.  Negative for cough and shortness of breath.    Cardiovascular: Negative.  Negative for chest pain.   Gastrointestinal: Negative.  Negative for constipation and nausea.   Endocrine: Negative.    Genitourinary: Negative.  Negative for hematuria and pelvic pain.   Musculoskeletal: Positive for neck pain. Negative for arthralgias.   Skin: Negative.    Allergic/Immunologic: Negative.    Neurological: Negative.  Negative for weakness and numbness.   Hematological: Negative.    Psychiatric/Behavioral: Negative for agitation and suicidal ideas. The patient is nervous/anxious.              GENERAL PHYSICAL EXAM:  Vitals: BP 129/79   Pulse 72   Temp 97.9 F (36.6 C) (Oral)   Resp 16   Ht 5\' 2"  (1.575 m)   Wt 199 lb (90.3 kg)   SpO2 99%   BMI 36.40 kg/m , Body mass index is 36.4 kg/m.  Physical Exam   Constitutional: She is oriented to  person, place, and time. She appears well-developed and well-nourished.   HENT:   Head: Normocephalic and atraumatic.   Eyes: Pupils are equal, round, and reactive to light. Conjunctivae and EOM are normal.   Neck: Normal range of motion. Neck supple. No tracheal deviation present. No thyromegaly present.   Cardiovascular: Normal rate and regular rhythm.    Pulmonary/Chest: Effort normal. No respiratory distress.   Abdominal: She exhibits no distension.   Musculoskeletal: Normal range of motion.   Lymphadenopathy:     She has no cervical adenopathy.   Neurological: She is alert and oriented to person, place, and time. She has normal strength. She displays no atrophy, no tremor and normal reflexes. No cranial nerve deficit or sensory deficit. She exhibits normal muscle tone. She displays a negative Romberg sign. Gait abnormal. Coordination normal.   Reflex Scores:       Tricep reflexes are 2+ on the right side and 2+ on the left side.       Bicep reflexes are 2+ on the right side and 2+ on the left side.  Skin: Skin is warm and dry.        Psychiatric: She has a normal mood and affect. Her speech is normal and behavior is normal. Judgment and thought content normal. Cognition and memory are normal.    Back Exam     Tenderness   The patient is experiencing tenderness in the cervical.    Range of Motion   Back extension: Painful and somewhat limited.   Back flexion: Painful and limited.     Other   Gait: normal   Erythema: no back redness  Scars: absent    Comments:  Cervical traction test is negative bilaterally. Facet loading is positive in the neck bilaterally also facet tenderness is present bilaterally.          Nurses Notes and Vital Signs  reviewed.    DATA  Labs:   Labs from from [facility].    Imaging:  Radiology Images and Reports reviewed where indicated and necessary  Impression:          1. Anterior fusion plate and screws at C6-7 without evidence of hardware complication.    2. Degenerative changes at C4-5 and C5-6.          Electronically Signed By: Windy Fast  on 03/14/2014 13:42       Patient Active Problem List   Diagnosis   . Depression   . Hypoactive thyroid   . Presence of intrauterine contraceptive device   . Leg pain   . 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 Spondylosis of cervical region without myelopathy or radiculopathy        ASSESSMENT    Lynn Russell is a 42 y.o. female with     1. Spondylosis of cervical region without myelopathy or radiculopathy    2. Arthropathy of cervical facet joint    3. Degenerative disc disease, cervical    4. Status post cervical spinal fusion    5. 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 Patient is able to increase the activity levels. Patient does not complain much pain and is overall satisfied with the pain relief. As patient is doing well at this time no further interventions are needed.  Counselling/Preventive measures for pain  Control:    [  x]  Spine strengthening exercises are discussed with patient in detail.     Patient is encouraged to exercise and counseled extensively on importance of exercise and strengthening the muscles to help the pain.   Patient is counseled on importance of exercise and,core strengthening.  Some  specific exercises to strengthen the abdominal muscles and low back muscles Were discussed.Also aquatic (water) physical therapy and benefits were explained to patient.including " Water supports the body and minimizes the effect of gravity, making it easier for patients to start an exercise program."   The following important principles  were discussed with patient:  1. Limit Bed Rest--Studies show that people with short-term low-back pain who rest feel more pain and have a harder time with daily tasks than those who stay active.  2. Keep Exercising-patient is advised to stay away from strenuous activities like gardening and avoid whatever motion caused the pain in the first place."  3. Maintain Good Posture-Exercises  to maintain good posture were shown to patient.  4. To do exercises learned in PT regularly.  [] Information (handout) on exercise was  given to patient.  [x]   Patient is counseled about  ill effects of smoking for 10 minutes -Patient is strongly urged to quit smoking   Following health benefits were discussed:  People who stop smoking greatly reduce their risk for disease and premature death. Lowered risk for lung cancer and many other types of cancer.  Reduced risk for coronary heart disease, stroke, and peripheral vascular disease.  Reduced coronary heart disease risk within 1 to 2 years of quitting.  Reduced respiratory symptoms, such as coughing, wheezing, and shortness of breath. The rate of decline in lung function is slower among people who quit smoking than among those who continue to smoke.  Reduced risk of developing chronic obstructive pulmonary disease (COPD), one of the leading causes of death in the [place].  Reduced risk for infertility in women of reproductive age. Women who stop smoking during pregnancy also reduce their risk of having a low birth weight baby.  Reduced risk of bone and joint damage.    Methods to Quit Smoking  The majority of cigarette smokers quit without using evidence-based   Counseling and medication are both effective for treating tobacco dependence, and using them together is more effective than using either one alone.Patient is advised to follow up with his physician for further management.       Decision Making Process : Patient's health history and referral records thoroughly reviewed  before focused physical examination and discussion with patient.   Over 50% of today's visit is spent on examining the patient and counseling.     Level of complexity of date to be reviewed is Moderate. The chart date reviewed include the following: Imaging Reports. Summary of Care.     Time spent reviewing with patient the below reports:   Medication safety, Treatment options.    Level of diagnosis and management options of this case is multiple: involving the following management options: Interventions as needed, medication management as appropriate, future visits, activity modification, heat/ice as needed, Urine drug screen as required.     [x] The patient's questions were answered to the best of my abilities.    This note was created using voice recognition software. There may be inaccuracies of transcription  that are inadvertently overlooked prior to the signature.  There is any questions about the transcription please contact me.  At this time as the patient is doing well we will  see the patient as needed. Patient is advised to contact the pain clinic if the pain returns are if our services are needed again    Electronically signed by Landis GandyKiran Chary Tammie Yanda, MD on 03/10/2017 at 8:44 PM

## 2017-03-10 NOTE — Discharge Instructions (Signed)
Healthy Upper Back: Exercises  Your Care Instructions  Here are some examples of exercises for your upper back. Start each exercise slowly. Ease off the exercise if you start to have pain.  Your doctor or physical therapist will tell you when you can start these exercises and which ones will work best for you.  How to do the exercises  Lower neck and upper back stretch    1. Stretch your arms out in front of your body. Clasp one hand on top of your other hand.  2. Gently reach out so that you feel your shoulder blades stretching away from each other.  3. Gently bend your head forward.  4. Hold for 15 to 30 seconds.  5. Repeat 2 to 4 times.    Midback stretch    1. Kneel on the floor, and sit back on your ankles.  2. Lean forward, place your hands on the floor, and stretch your arms out in front of you. Rest your head between your arms.  3. Gently push your chest toward the floor, reaching as far in front of you as possible.  4. Hold for 15 to 30 seconds.  5. Repeat 2 to 4 times.    Shoulder rolls    1. Sit comfortably with your feet shoulder-width apart. You can also do this exercise while standing.  2. Roll your shoulders up, then back, and then down in a smooth, circular motion.  3. Repeat 2 to 4 times.    Wall push-up    1. Stand against a wall with your feet about 12 to 24 inches back from the wall. If you feel any pain when you do this exercise, stand closer to the wall.  2. Place your hands on the wall slightly wider apart than your shoulders, and lean forward.  3. Gently lean your body toward the wall. Then push back to your starting position. Keep the motion smooth and controlled.  4. Repeat 8 to 12 times.    Resisted shoulder blade squeeze    1. Sit or stand, holding the band in both hands in front of you. Keep your elbows close to your sides, bent at a 90-degree angle. Your palms should face up.  2. Squeeze your shoulder blades together, and move your arms to the outside, stretching the band. Be sure  to keep your elbows at your sides while you do this.  3. Relax.  4. Repeat 8 to 12 times.    Resisted rows    1. Put the band around a solid object, such as a bedpost, at about waist level. Hold one end of the band in each hand.  2. With your elbows at your sides and bent to 90 degrees, pull the band back to move your shoulder blades toward each other. Return to the starting position.  3. Repeat 8 to 12 times.    Follow-up care is a key part of your treatment and safety. Be sure to make and go to all appointments, and call your doctor if you are having problems. It's also a good idea to know your test results and keep a list of the medicines you take.  Where can you learn more?  Go to [email or web address] and sign in to your MyChart account. Enter T680 in the Search Health Information box to learn more about "Healthy Upper Back: Exercises."     If you do not have an account, please click on the "Sign Up Now" link.  Current as of: October 14, 2016  Content Version: 11.9   2006-2018 [employer]. Care instructions adapted under license by [facility]. If you have questions about a medical condition or this instruction, always ask your healthcare professional. [employer] disclaims any warranty or liability for your use of this information.         Neck Arthritis: Exercises  Your Care Instructions  Here are some examples of typical rehabilitation exercises for your condition. Start each exercise slowly. Ease off the exercise if you start to have pain.  Your doctor or physical therapist will tell you when you can start these exercises and which ones will work best for you.  How to do the exercises  Neck stretches to the side    6. This stretch works best if you keep your shoulder down as you lean away from it. To help you remember to do this, start by relaxing your shoulders and lightly holding on to your thighs or your chair.  7. Tilt your head toward your shoulder and  hold for 15 to 30 seconds. Let the weight of your head stretch your muscles.  8. Repeat 2 to 4 times toward each shoulder.    Chin tuck    6. Lie on the floor with a rolled-up towel under your neck. Your head should be touching the floor.  7. Slowly bring your chin toward your chest.  8. Hold for a count of 6, and then relax for up to 10 seconds.  9. Repeat 8 to 12 times.    Active cervical rotation    4. Sit in a firm chair, or stand up straight.  5. Keeping your chin level, turn your head to the right, and hold for 15 to 30 seconds.  6. Turn your head to the left and hold for 15 to 30 seconds.  7. Repeat 2 to 4 times to each side.    Shoulder blade squeeze    5. While standing, squeeze your shoulder blades together.  6. Do not raise your shoulders up as you are squeezing.  7. Hold for 6 seconds.  8. Repeat 8 to 12 times.    Shoulder rolls    5. Sit comfortably with your feet shoulder-width apart. You can also do this exercise standing up.  6. Roll your shoulders up, then back, and then down in a smooth, circular motion.  7. Repeat 2 to 4 times.    Follow-up care is a key part of your treatment and safety. Be sure to make and go to all appointments, and call your doctor if you are having problems. It's also a good idea to know your test results and keep a list of the medicines you take.  Where can you learn more?  Go to [email or web address] and sign in to your MyChart account. Enter [phone] in the Search Health Information box to learn more about "Neck Arthritis: Exercises."     If you do not have an account, please click on the "Sign Up Now" link.  Current as of: October 14, 2016  Content Version: 11.9   2006-2018 [employer]. Care instructions adapted under license by [facility]. If you have questions about a medical condition or this instruction, always ask your healthcare professional. [employer] disclaims any warranty or liability for your use of this  information.          RECHECK/EVALUATION DISCHARGE    TODAY'S PROCEDURE:   Reason for Visit:      The following information was given to  and explained to the patient and/or family:     medication management options, invasive pain management options, physical therapy option, non-invasive options, basic spine anatomy, and importance of core muscle and back strengthening exercise program.        CALL TO SCHEDULE THE FOLLOWING APPOINTMENTS  _________________________  (Please schedule your appointments as soon as possible)  RETURN:   Visit type:     With:    Additional Comments:      If you feel your medications need to be adjusted or changed, you must make an appointment on the EVALUATION SCHEDULE. Medications are not adjusted over the phone.    Please contact our office with any questions or to report any problems you may be experiencing.  If we are not available when you call, your call will be returned at our first opportunity.  Chronic pain is not an emergency.  If you feel you need to schedule an emergency evaluation regarding care you received at the pain clinic, please contact our clinic at [phone].  At other times, go to the nearest Emergency Room.    Please call [facility] at [phone]  for after hour assistance.  Monday - Friday after 5 pm. Saturday and Sunday all day.    The above information has been explained to my satisfaction and understanding.  I have no unanswered questions or concerns.     _____________________________Patient Signature

## 2017-07-06 IMAGING — US US NON OB TRANSVAGINAL W LTD TA
1 series · 14 of 28 positions shown · non-contrast
Comparison: 04/20/2011

HISTORY: Vaginal pain
TECHNIQUE: US transabdominal and transvaginal

[Series 1: us non ob transvaginal w ltd ta · 14 of 60 slices shown]
[im 3/60]
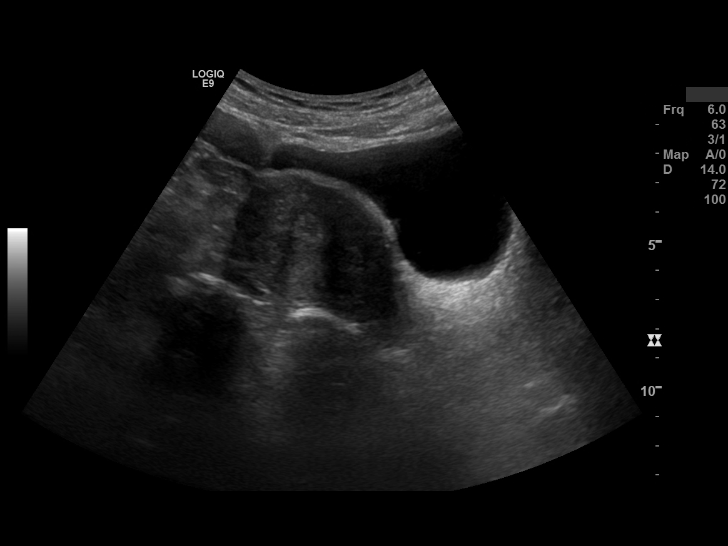
[im 7/60]
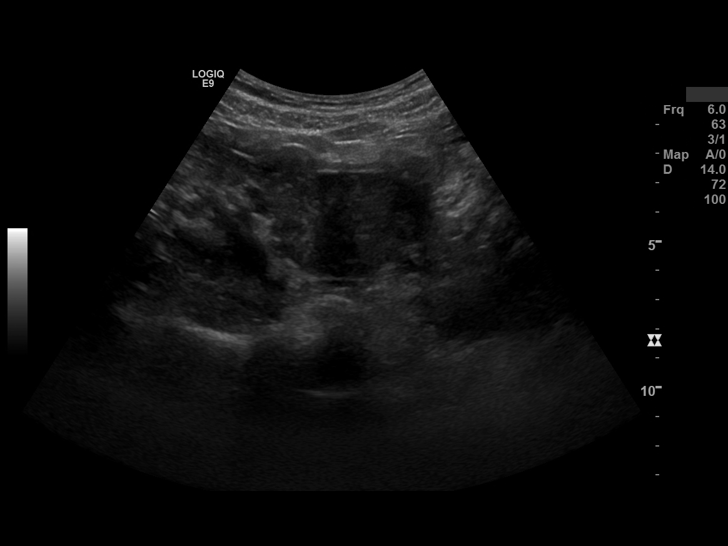
[im 11/60]
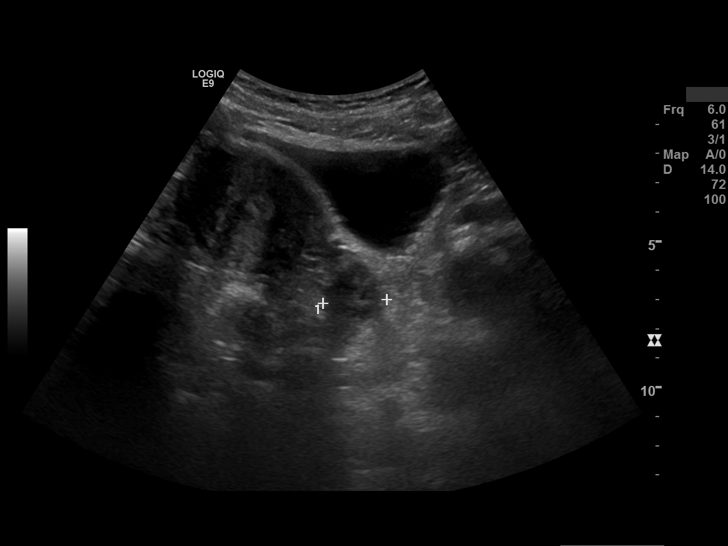
[im 16/60]
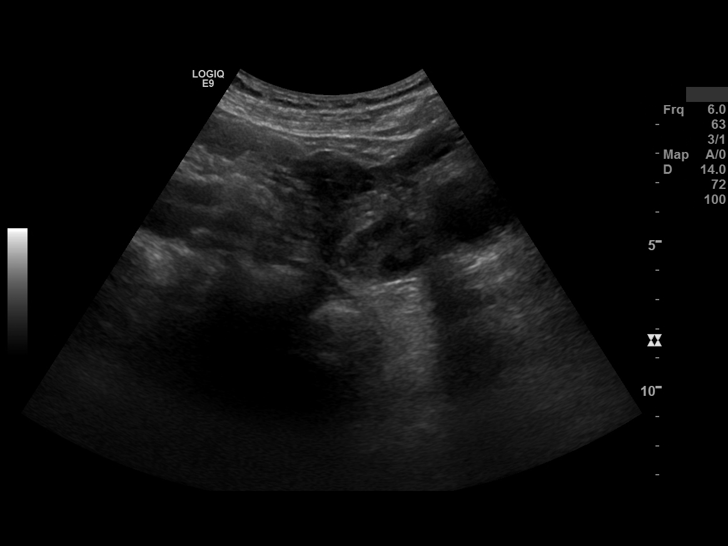
[im 20/60]
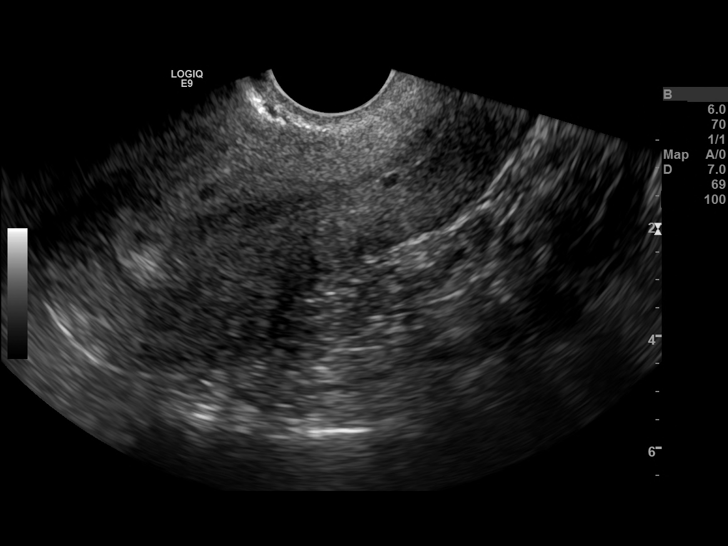
[im 25/60]
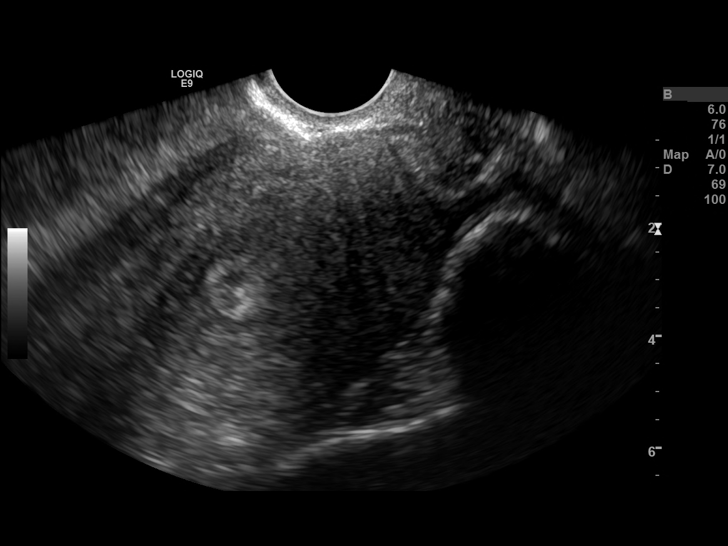
[im 29/60]
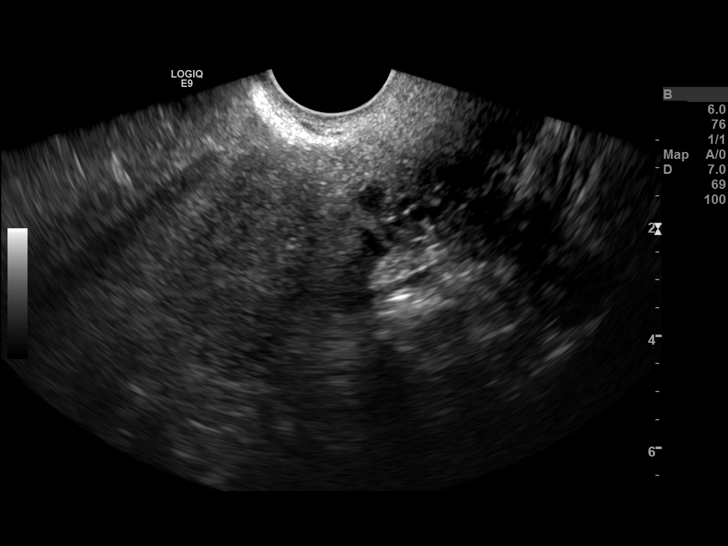
[im 33/60]
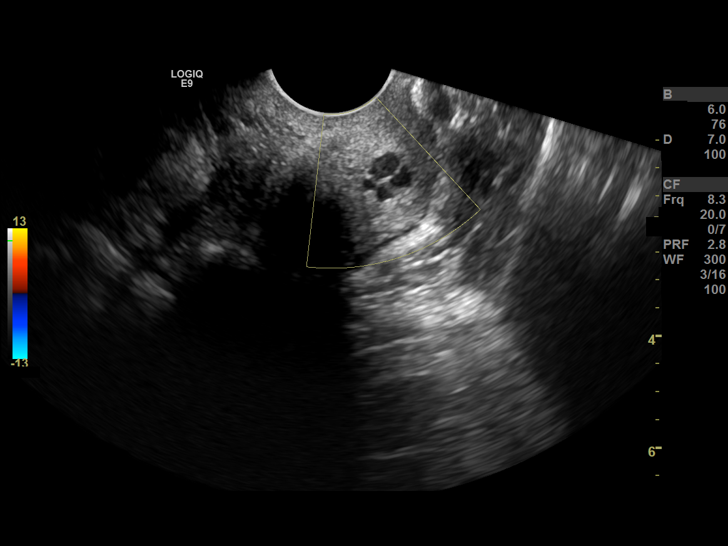
[im 38/60]
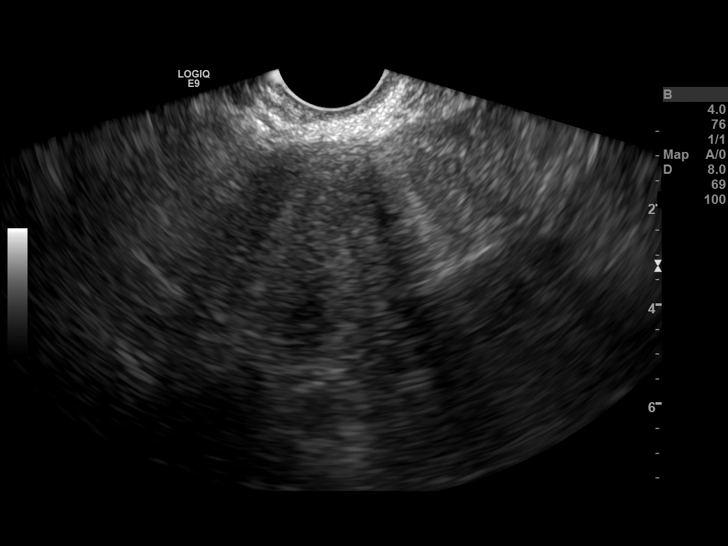
[im 42/60]
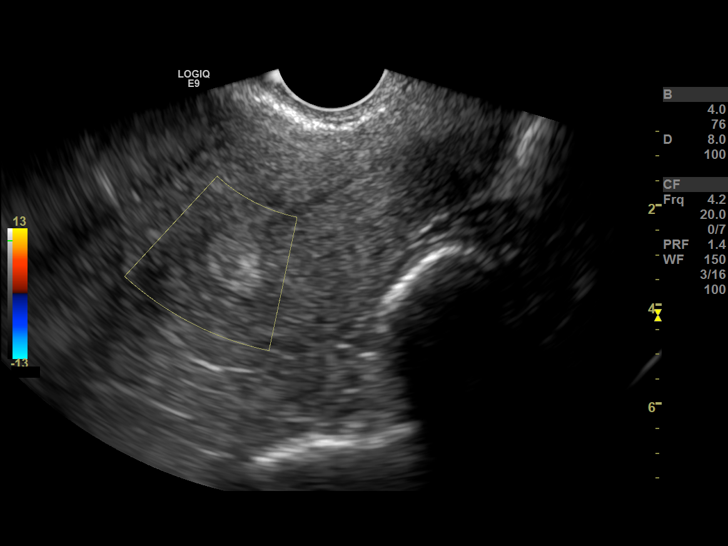
[im 46/60]
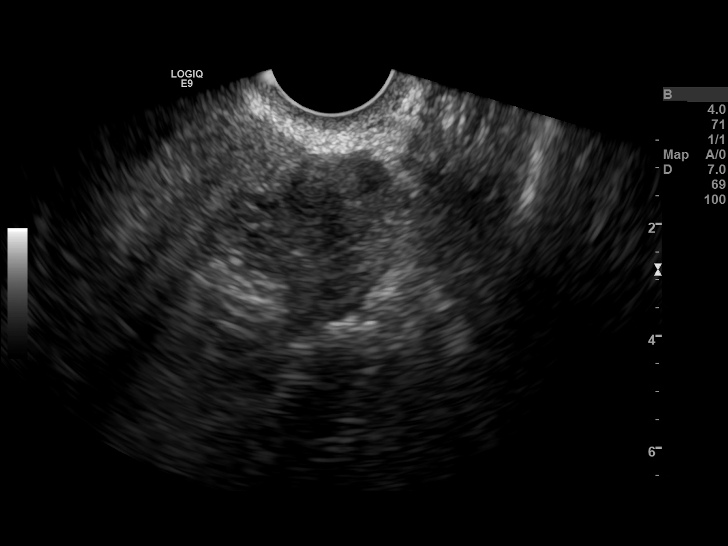
[im 51/60]
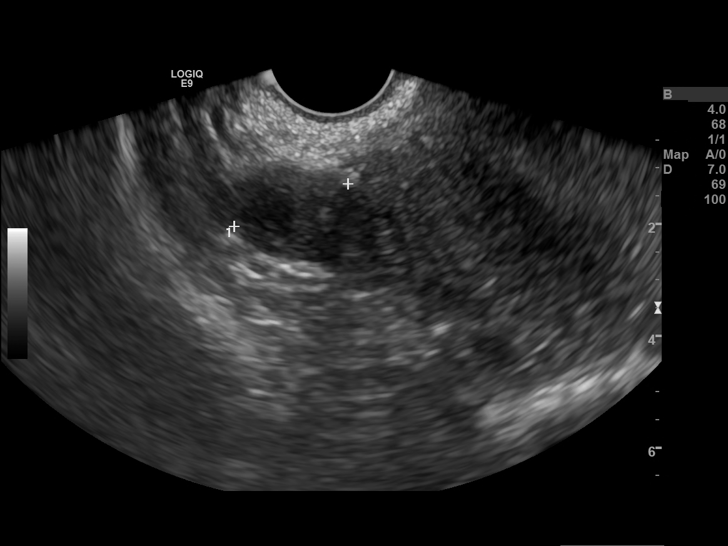
[im 55/60]
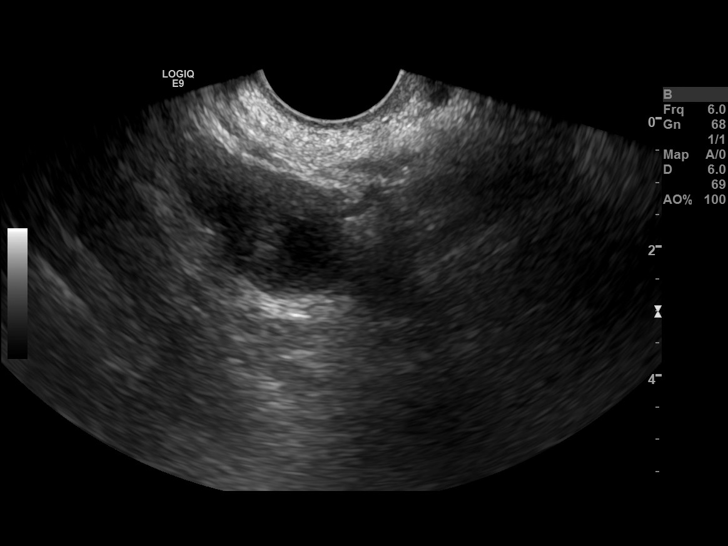
[im 60/60]
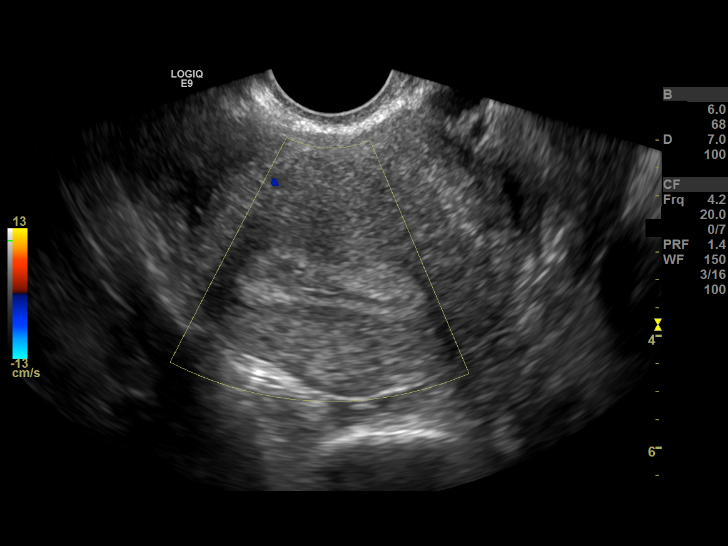

[14 of 28 positions shown; findings below may reference images not displayed]

FINDINGS: Transabdominal and transvaginal imaging shows an anteverted uterus 8.8 x 4.6 x 6.6 cm, normal in appearance with 10 mm of endometrium. There are nabothian cysts in the cervix. No other myometrial or endometrial abnormalities are seen.

Right ovary 3.8 x 2.1 x 2.2 cm with follicles.

Left ovary 3.7 x 2.6 x 2.3 cm with follicles.

No free fluid or adnexal abnormalities seen.
IMPRESSION: No significant abnormality identified

## 2017-08-03 IMAGING — CR ANKLE LT 3 VWS MIN
1 series · 3 of 3 positions shown · non-contrast
Comparison: None.

LEFT ANKLE RADIOGRAPHS, 3 VIEWS
INDICATION: Left ankle pain, injury.

[Series 1: ap · 0.17mm/px · 3 of 3 slices shown]
[im 1/3]
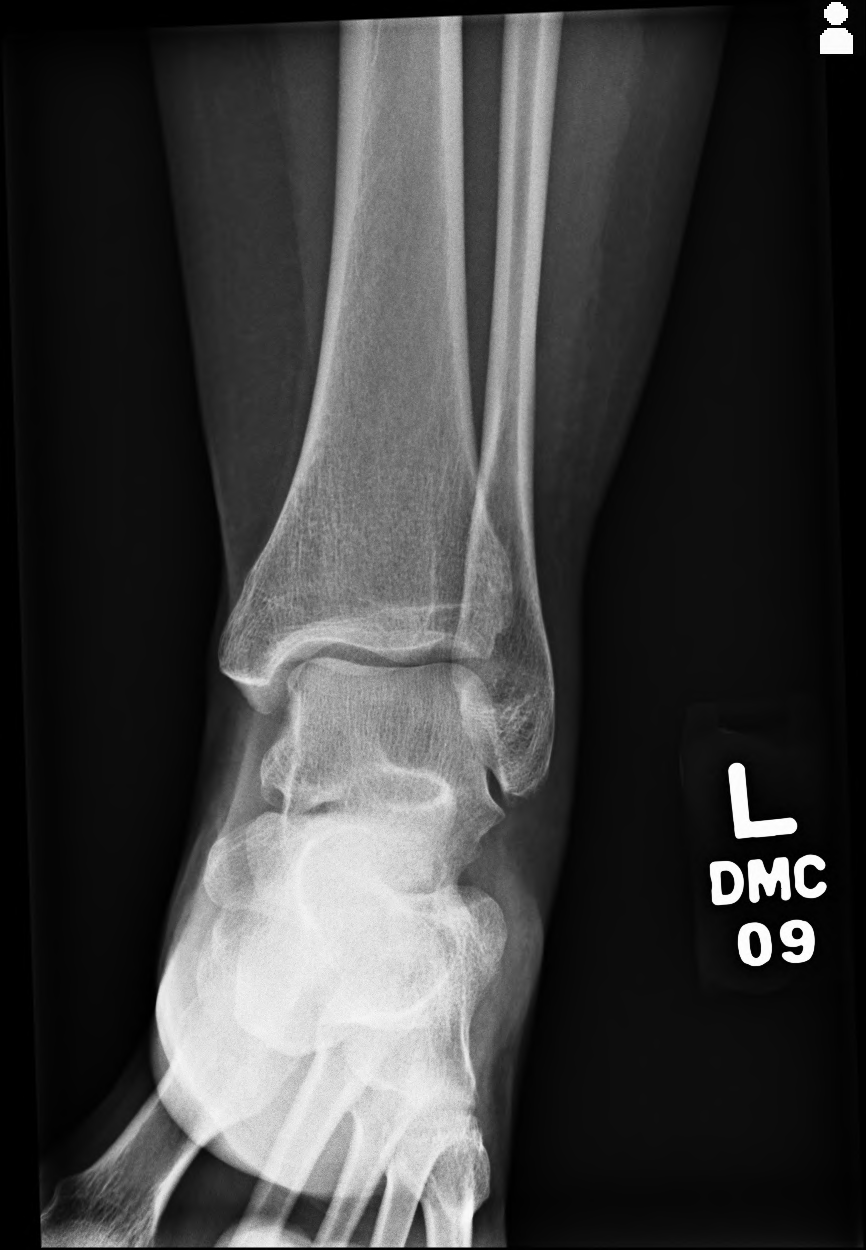
[im 2/3]
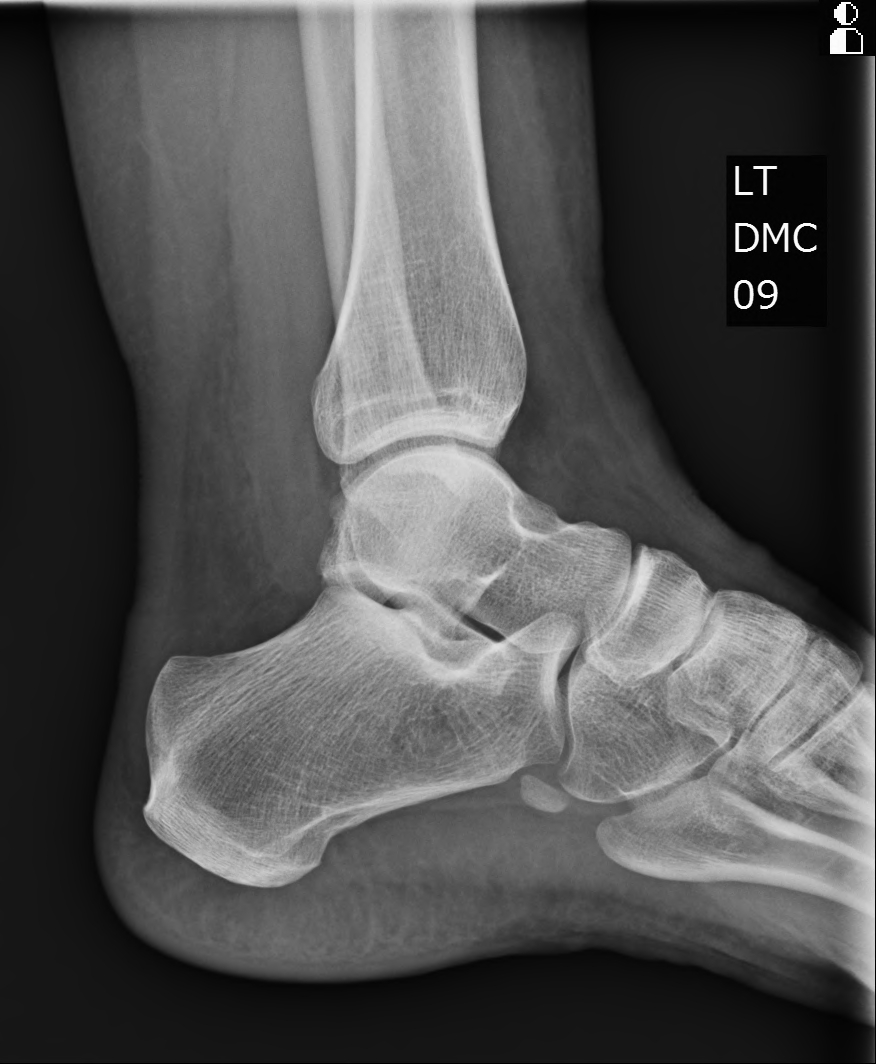
[im 3/3]
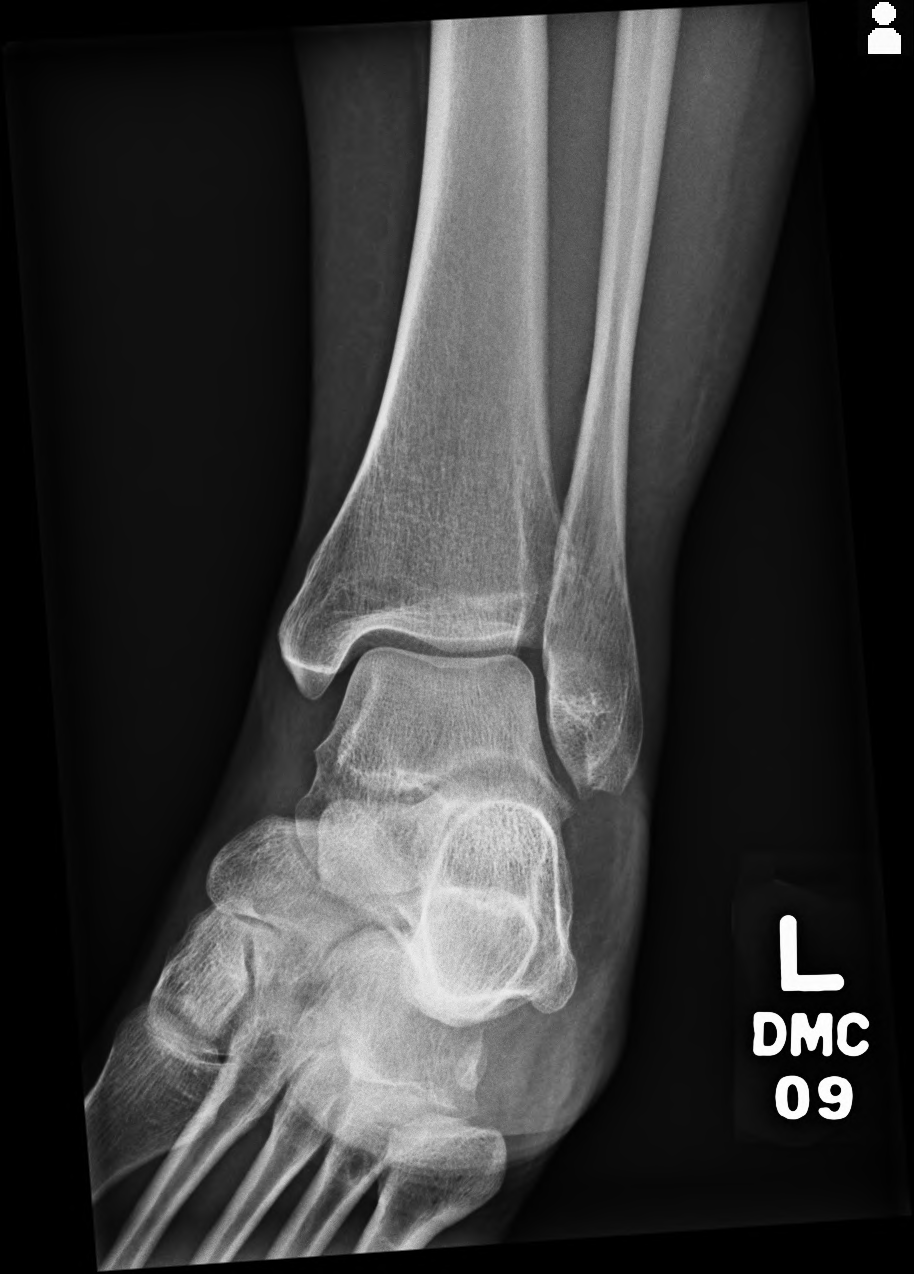

[3 of 3 positions shown; findings below may reference images not displayed]

FINDINGS: No acute fracture or dislocation. No joint effusion. Joint spaces are intact.
IMPRESSION: No acute osseous finding.

## 2017-08-03 IMAGING — CR FOOT LT 3 VWS MIN
1 series · 3 of 3 positions shown · non-contrast
Comparison: None

Left foot radiographs, 3 views
INDICATION: Left foot pain, injury

[Series 1: lat · 0.17mm/px · 3 of 3 slices shown]
[im 1/3]
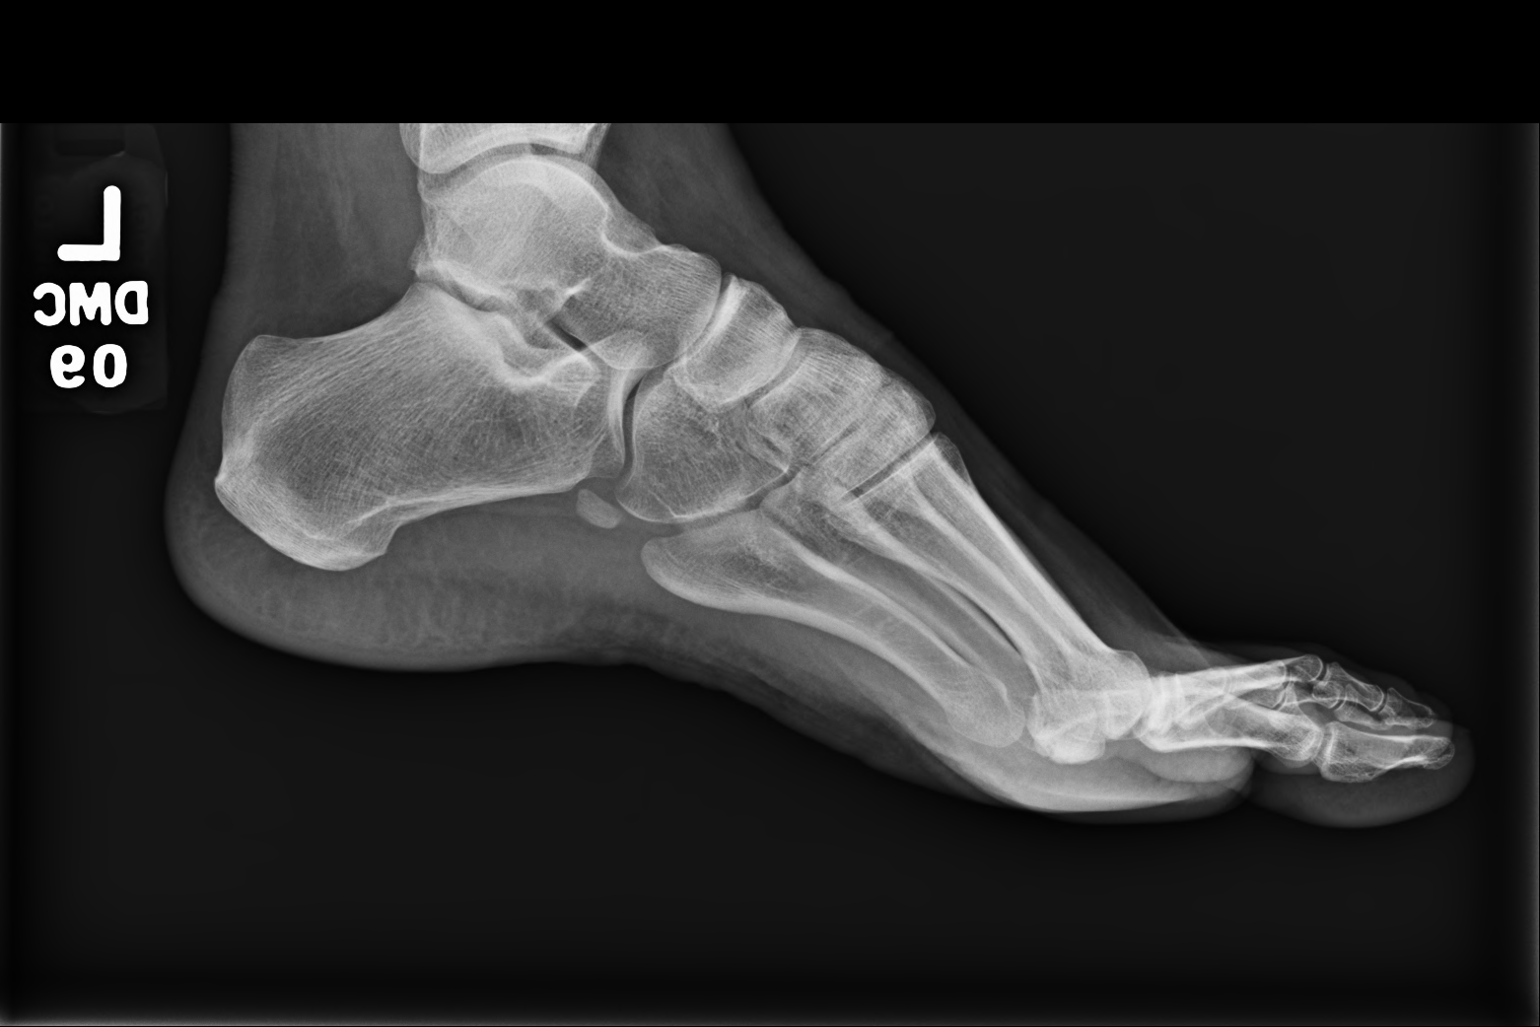
[im 2/3]
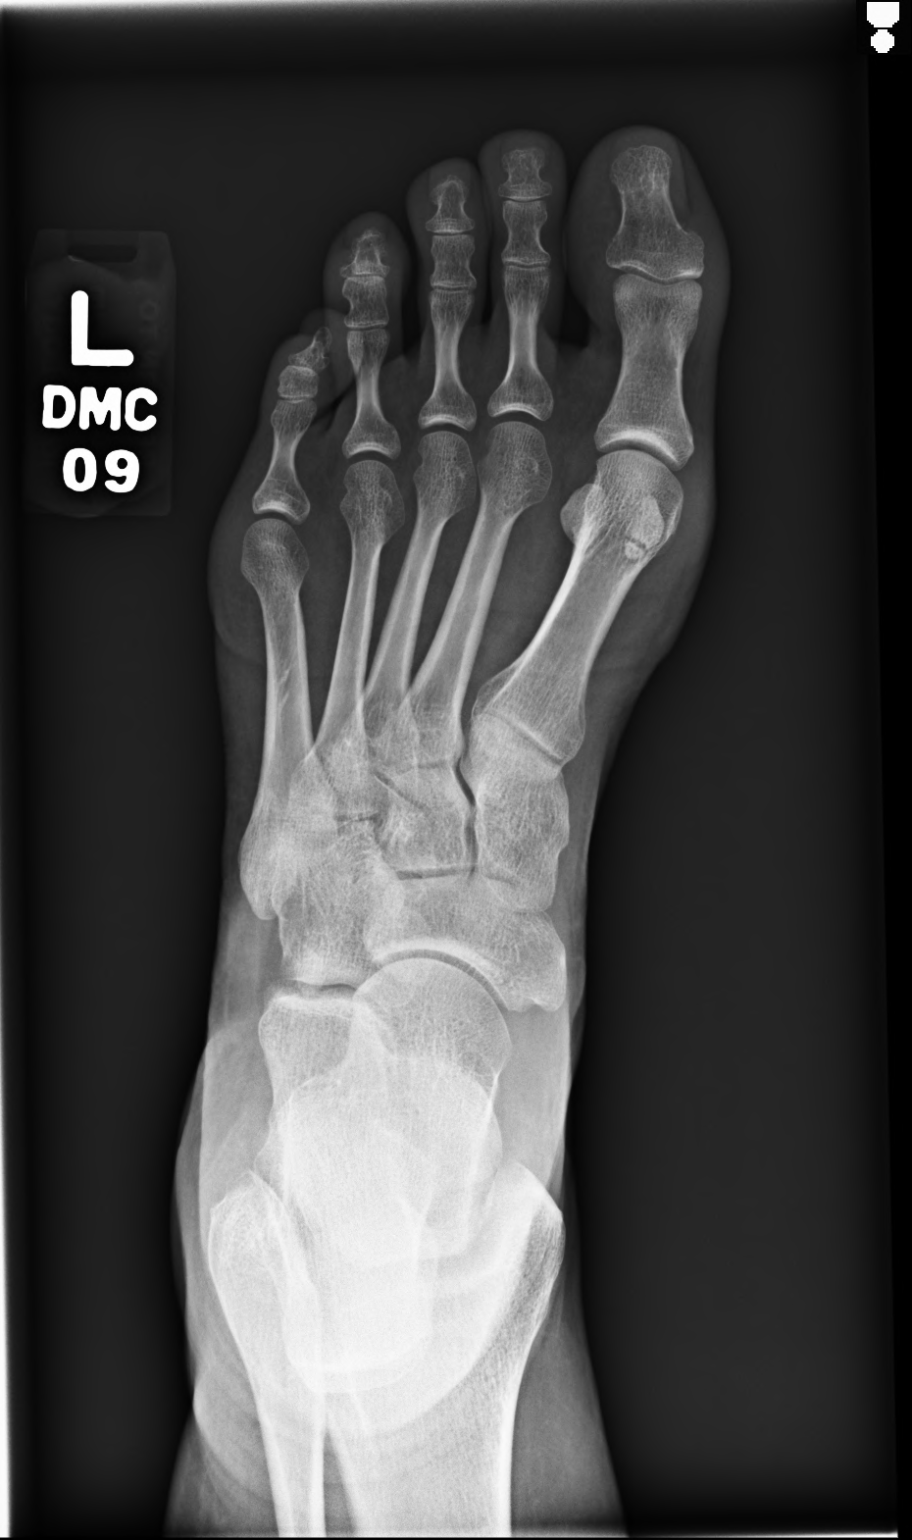
[im 3/3]
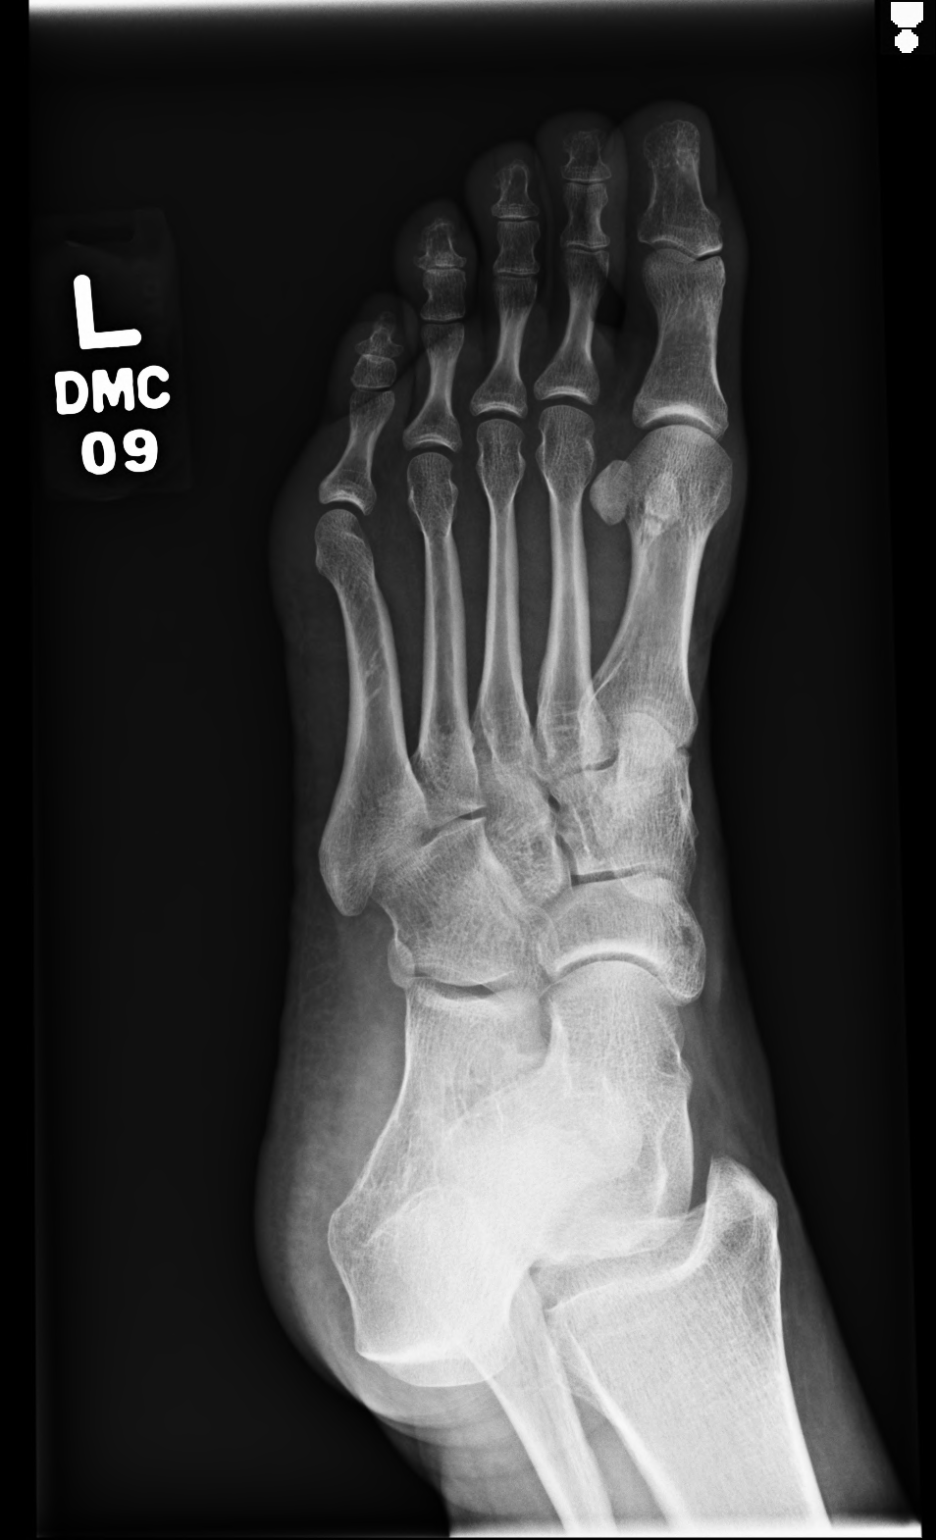

[3 of 3 positions shown; findings below may reference images not displayed]

FINDINGS: No acute fracture or dislocation. No joint effusion. Joint spaces are intact. No erosions.
IMPRESSION: No acute osseous finding.

## 2018-04-24 IMAGING — CR FOOT LT 3 VWS MIN
1 series · 3 of 3 positions shown · non-contrast
Comparison: 08/03/2017.

LEFT FOOT RADIOGRAPHS, 3 VIEWS
INDICATION: Left foot pain.

[Series 1: lat · 0.17mm/px · 3 of 3 slices shown]
[im 1/3]
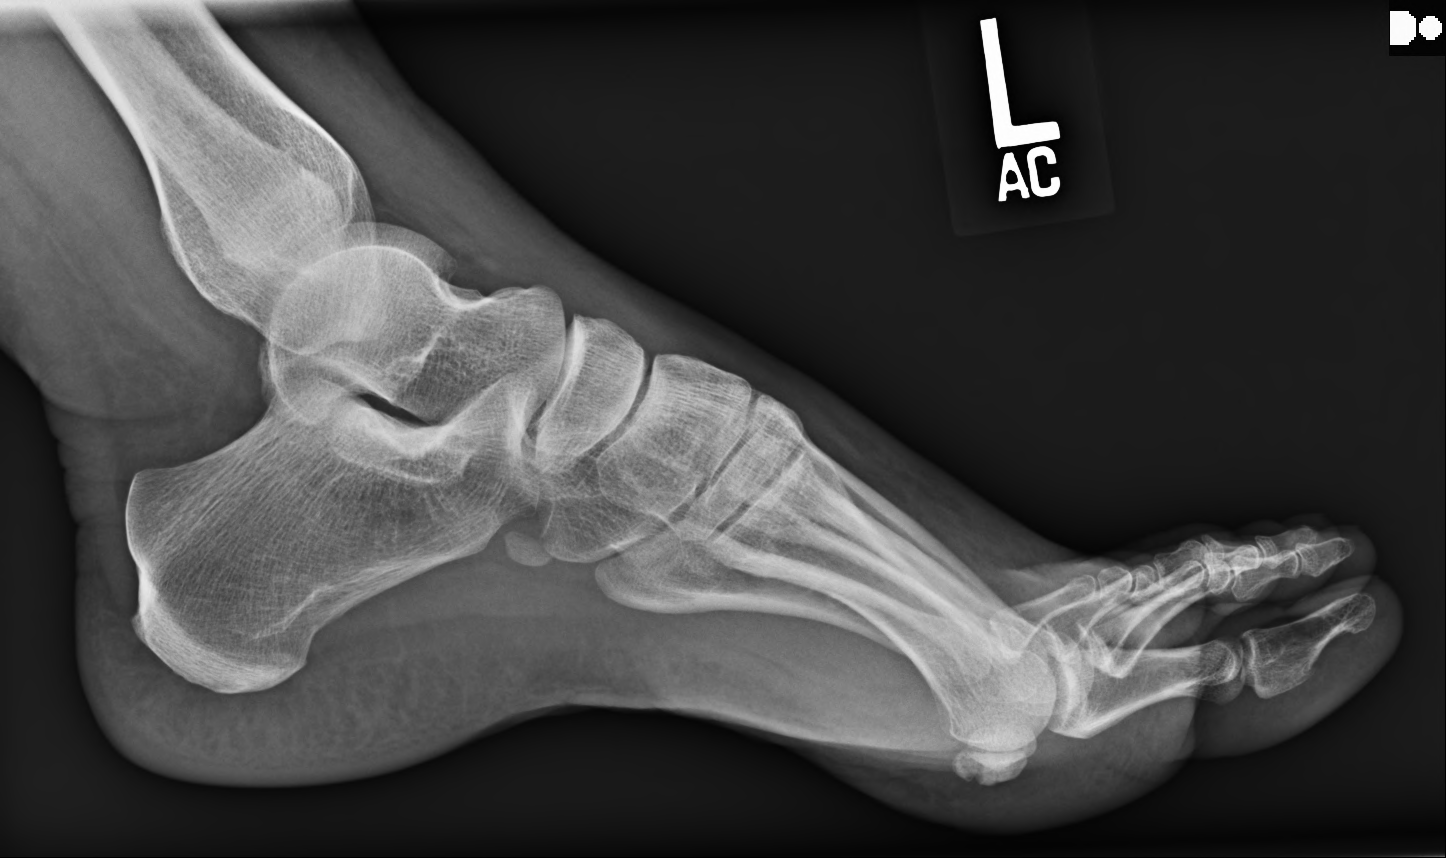
[im 2/3]
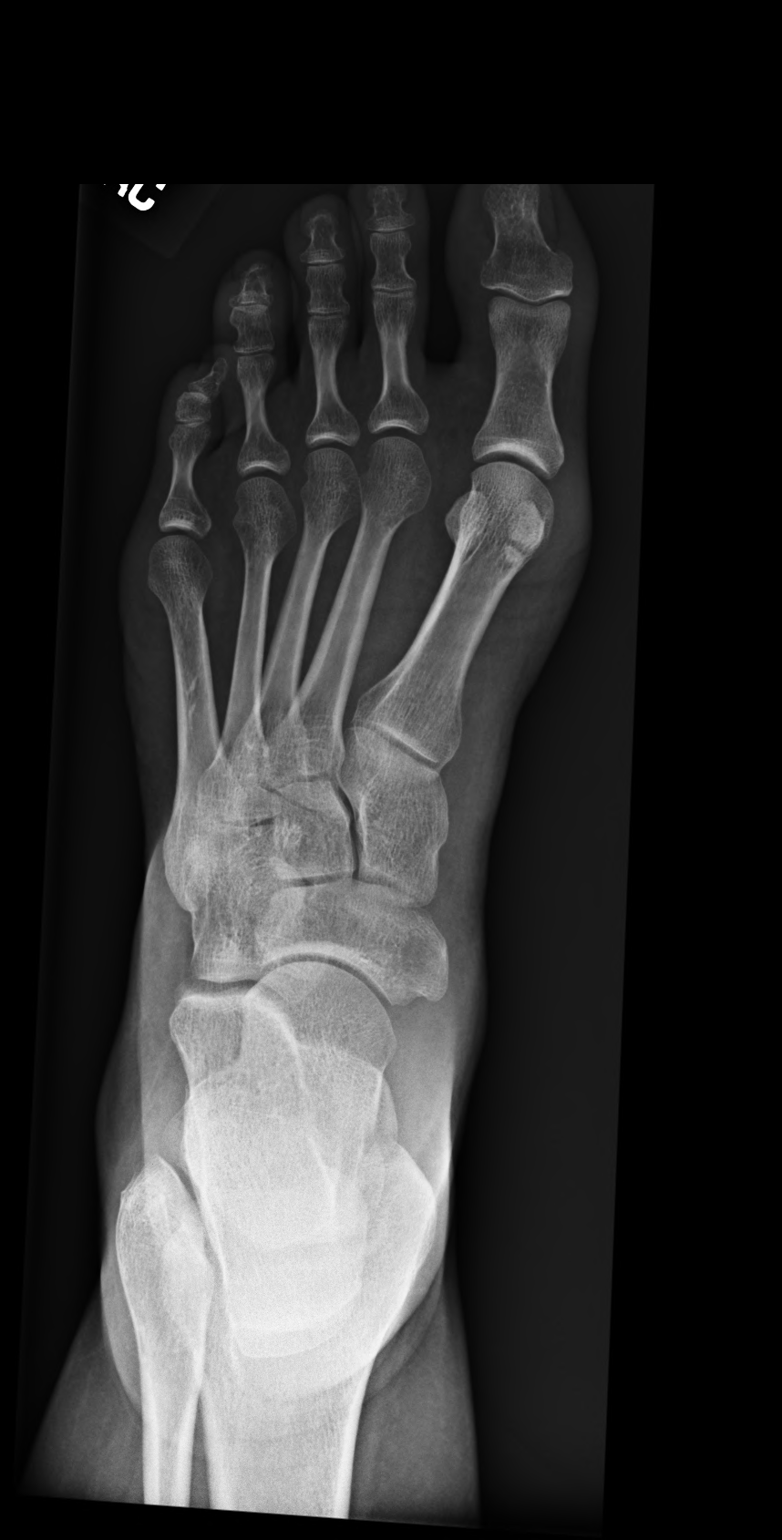
[im 3/3]
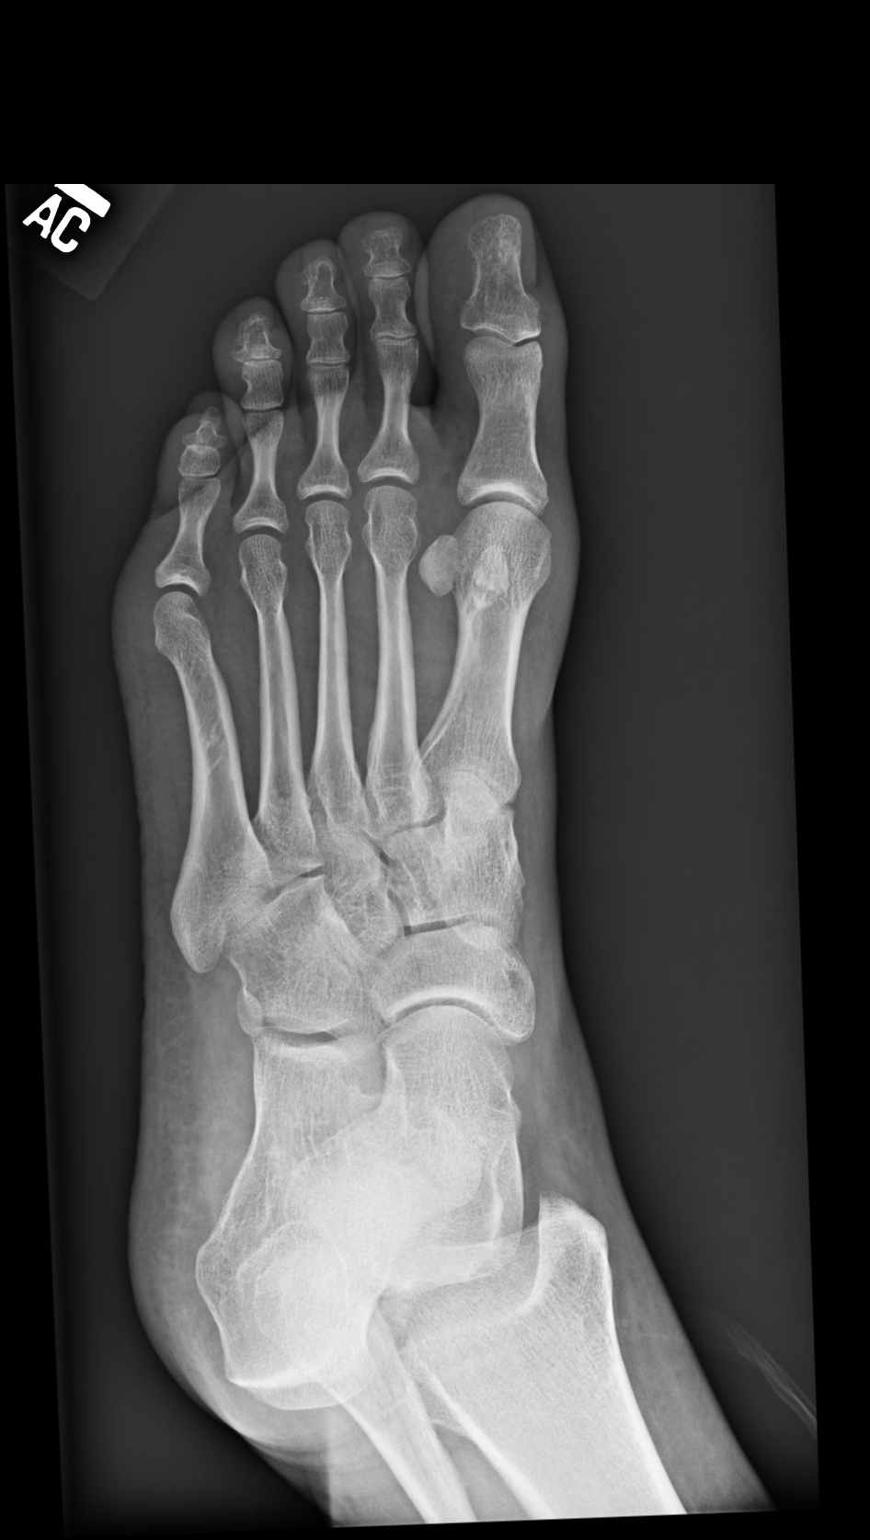

[3 of 3 positions shown; findings below may reference images not displayed]

FINDINGS: No acute fracture. No dislocation. Metatarsus primus varus. Transverse fracture versus bipartite tibial sesamoid. Soft tissue swelling about the great toe. Joint spaces are intact. No erosions. Mild Henriette Feliz. Tiny Achilles enthesophyte. No joint effusion. No tarsal coalition.
IMPRESSION: 1. Transverse acute fracture of the tibial hallux sesamoid versus bipartite tibial hallux sesamoid. Consider noncontrast MRI of the forefoot.

2. Soft tissue swelling about the first MTP joint.

## 2018-08-17 IMAGING — CR HAND LT 3 VWS MIN
1 series · 3 of 3 positions shown · non-contrast
Comparison: None

Left hand radiographs, 3 views
INDICATION: Left hand pain

[Series 1: lat · 0.17mm/px · 3 of 3 slices shown]
[im 1/3]
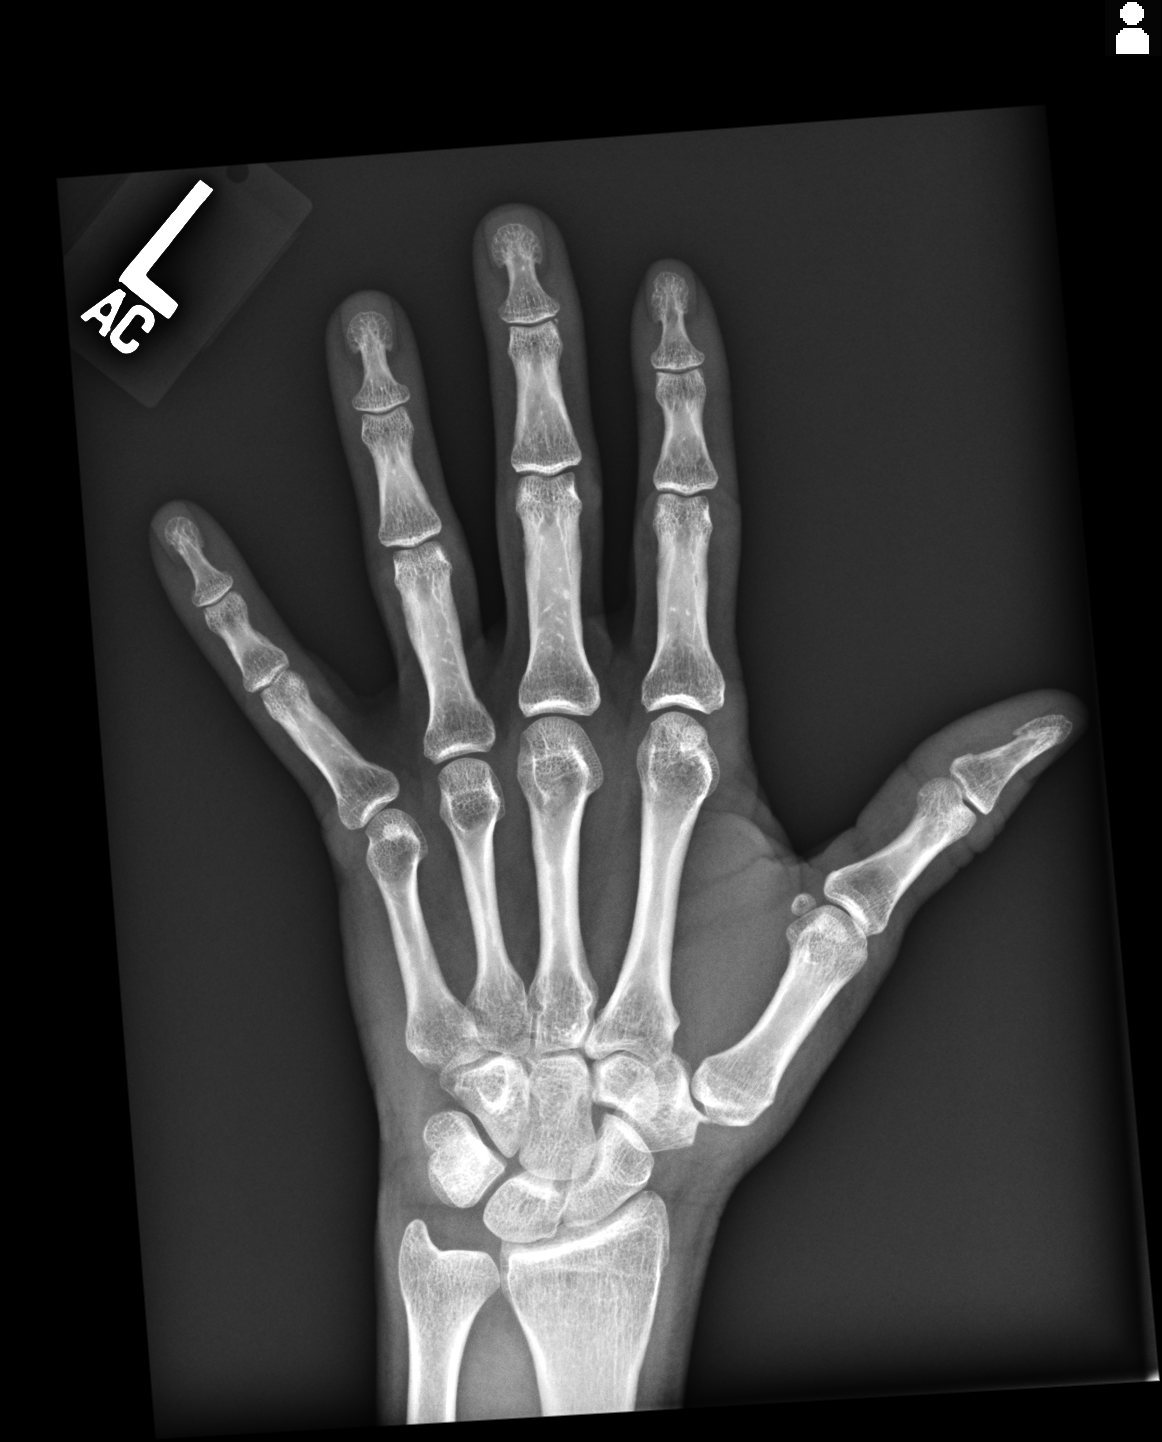
[im 2/3]
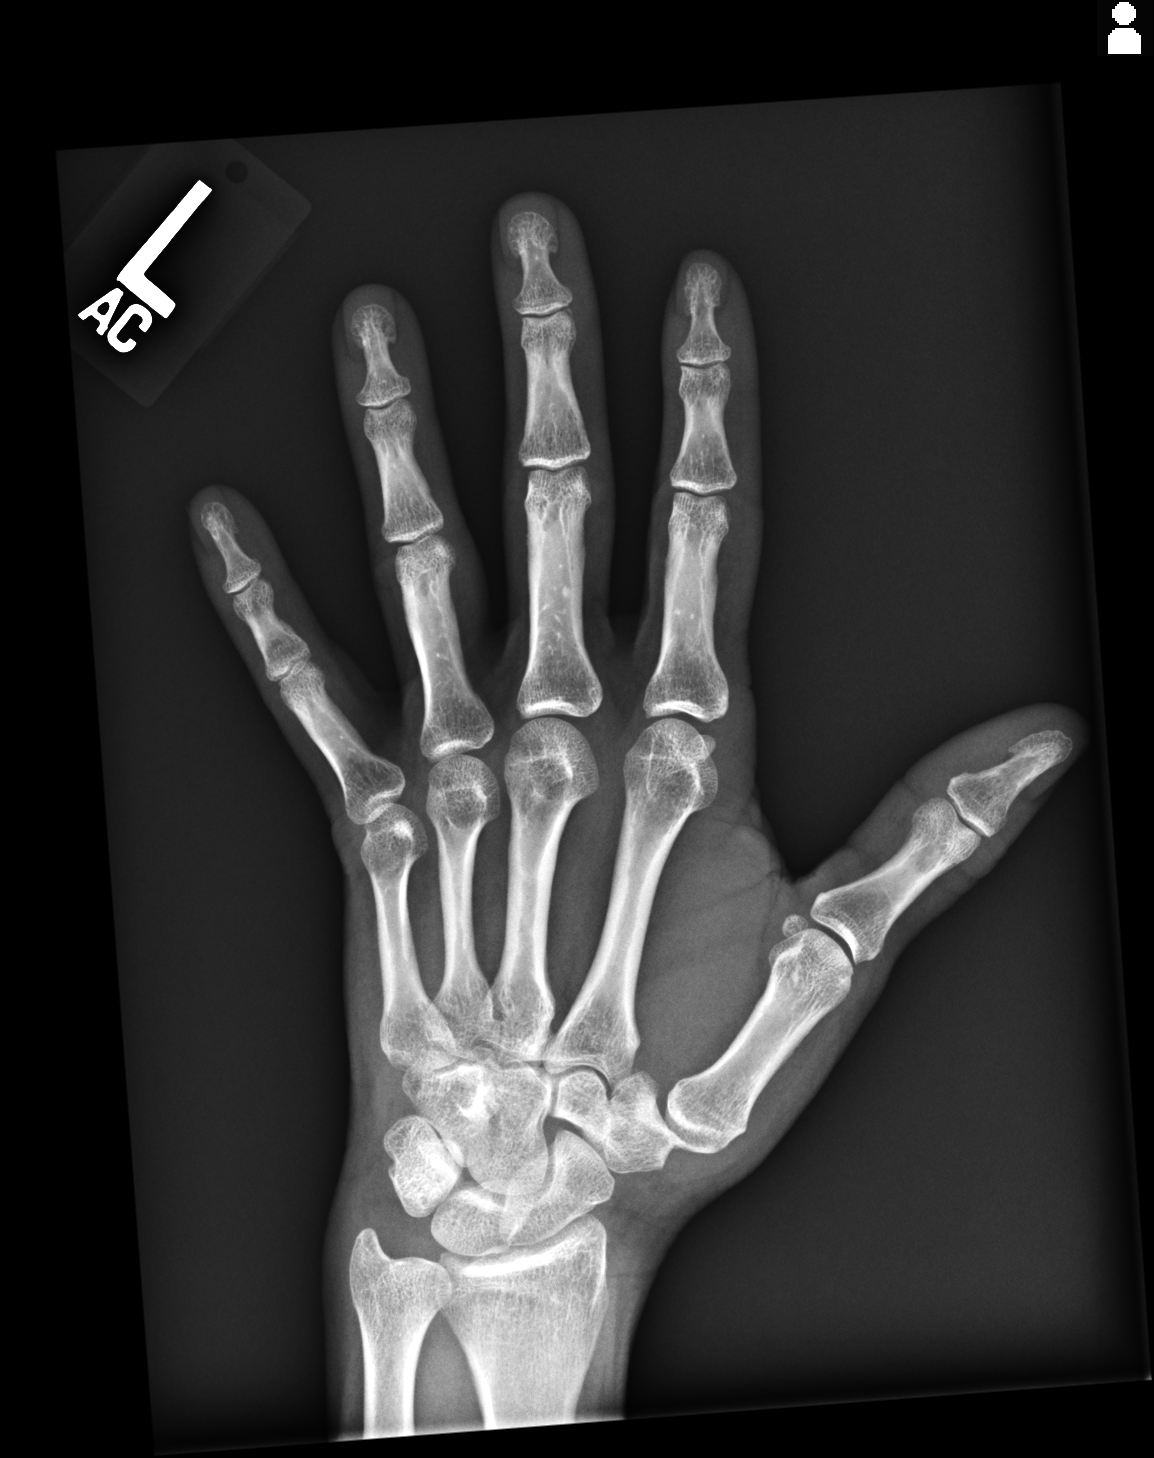
[im 3/3]
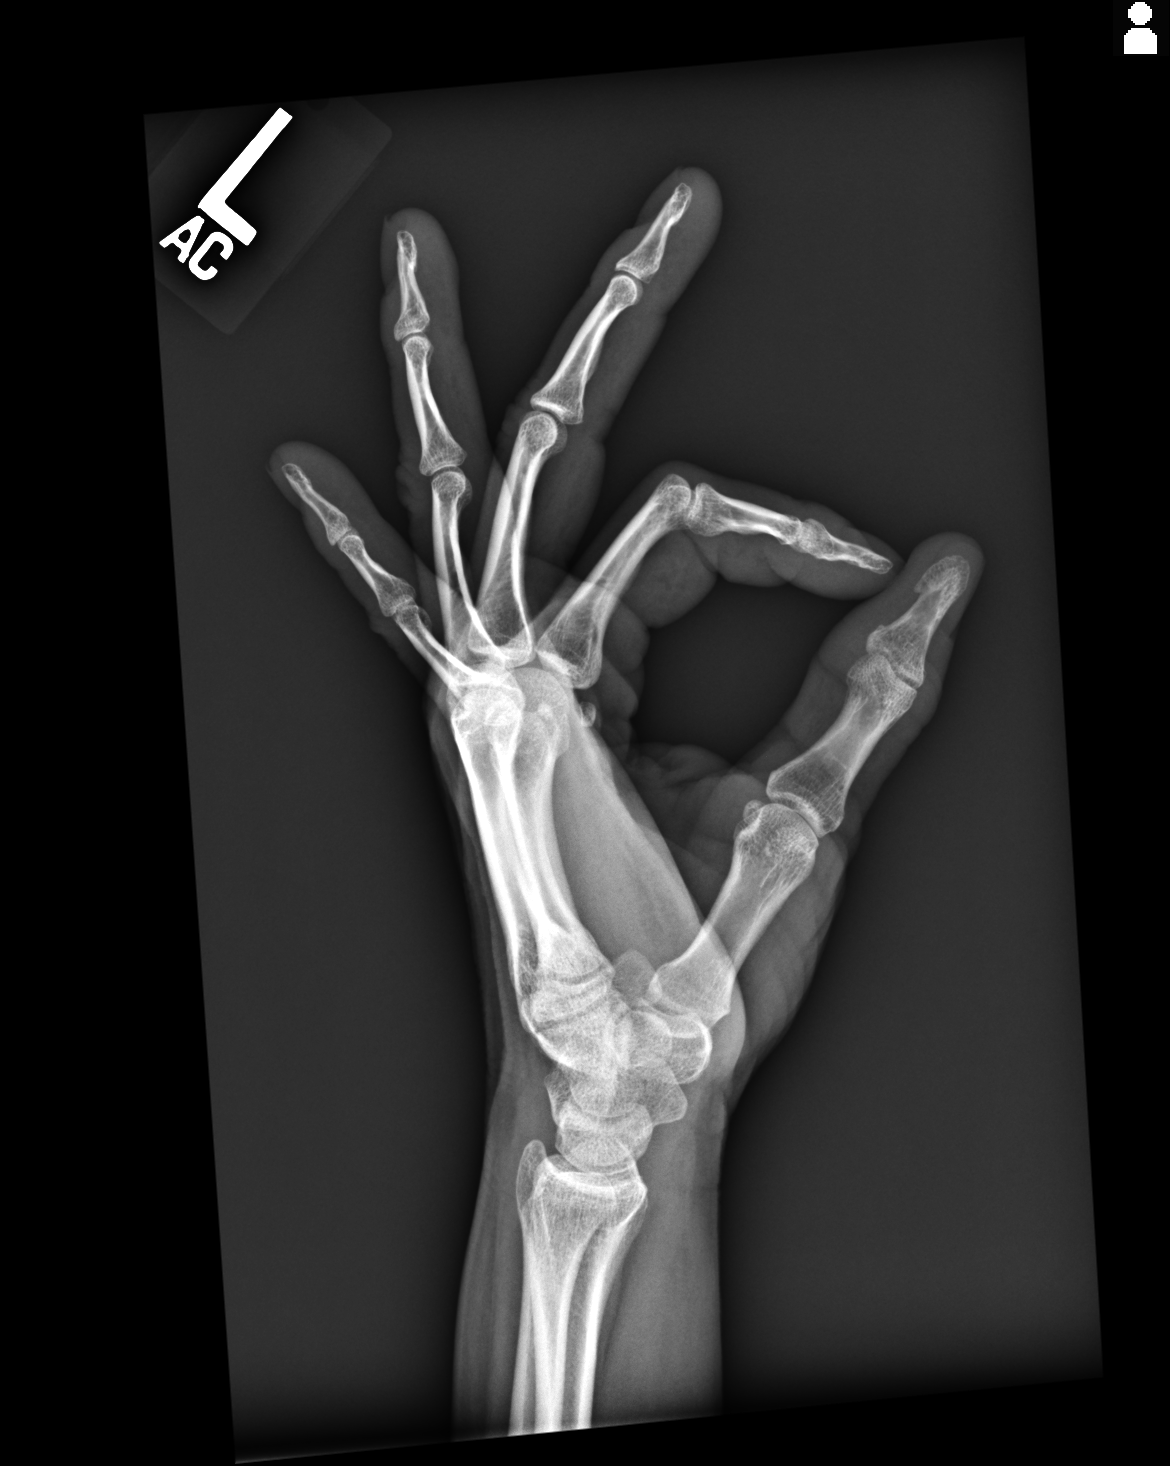

[3 of 3 positions shown; findings below may reference images not displayed]

FINDINGS: No acute fracture or dislocation. Joint spaces are intact.  No foreign body. No erosions.
IMPRESSION: Unremarkable left hand radiographs.

## 2018-08-24 IMAGING — MG MAMMO SCRN BIL W/CAD TOMO
9 of 12 series · 9 of 28 positions shown · non-contrast
Comparison: The present examination has been compared to prior imaging studies.

Images Obtained from [facility]
INDICATION: Screening.
TECHNIQUE: Bilateral 2-D digital screening mammogram was performed followed by 3-D tomosynthesis.  Current study was also evaluated with a computer aided detection (CAD) system.
MAMMOGRAM FINDINGS:
The breasts are heterogeneously dense, which may obscure small masses.
There are bilateral retro-pectoral silicone gel implants.
No suspicious abnormality is seen in either breast.

[R CC (1 of 2)]
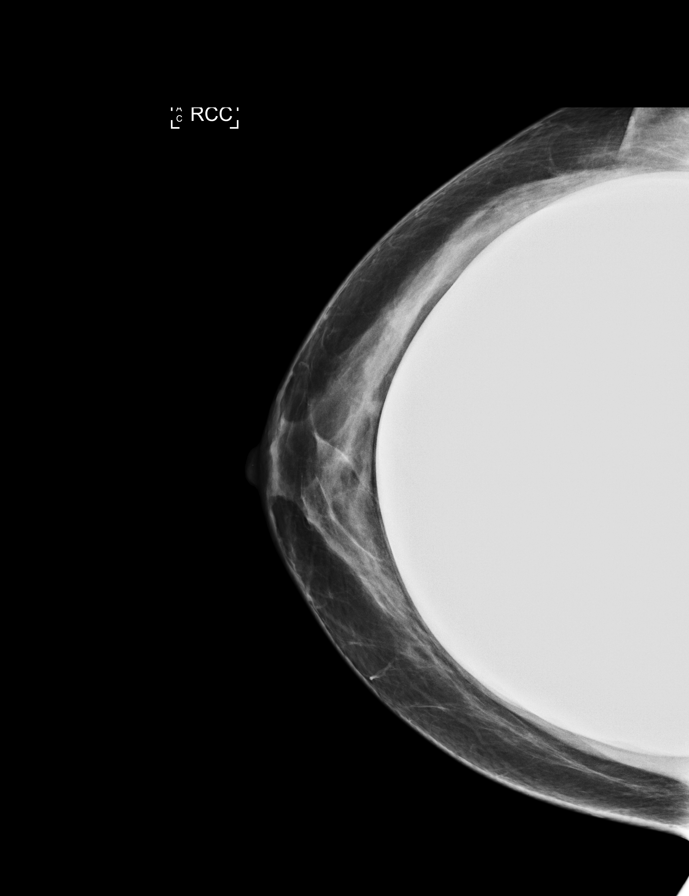

[R MLO (1 of 2)]
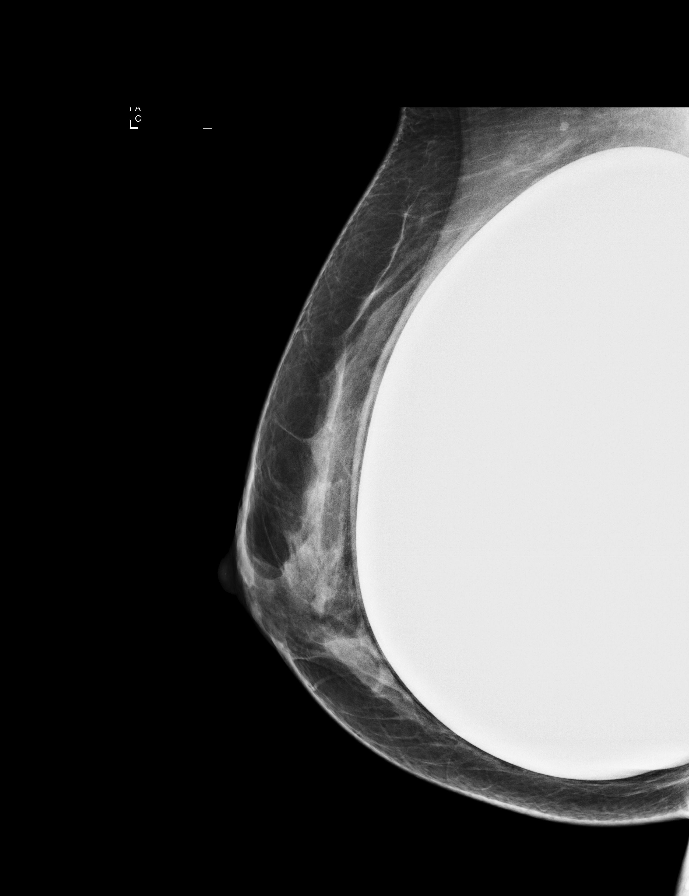

[L CC (1 of 2)]
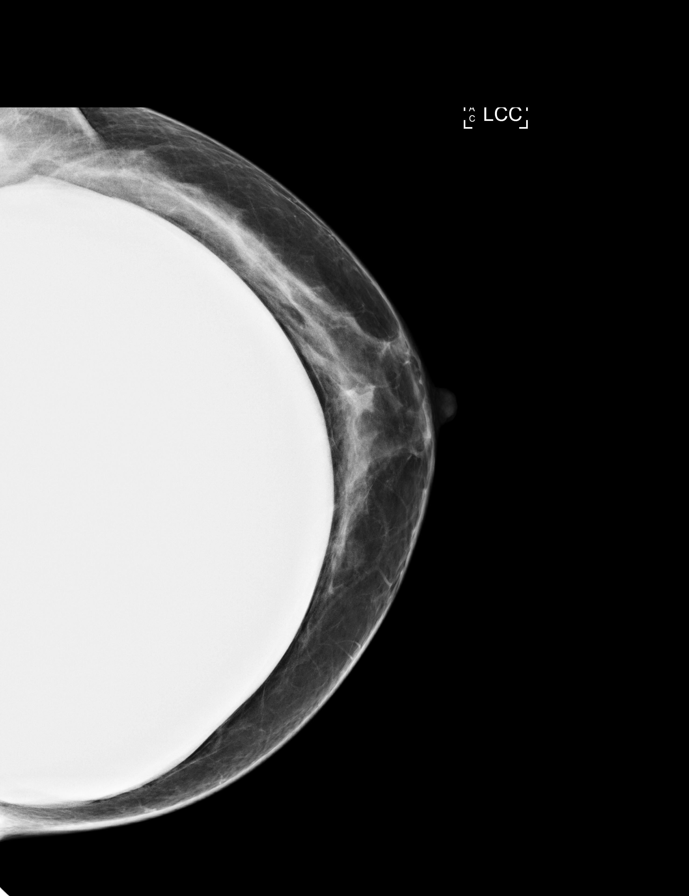

[L MLO (1 of 2)]
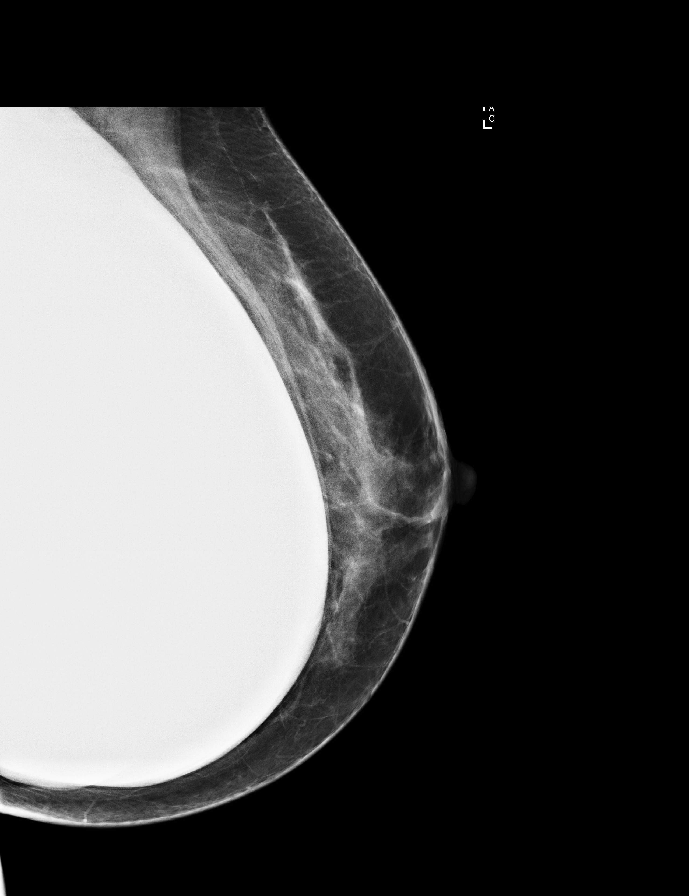

[L MLO (2 of 2)]
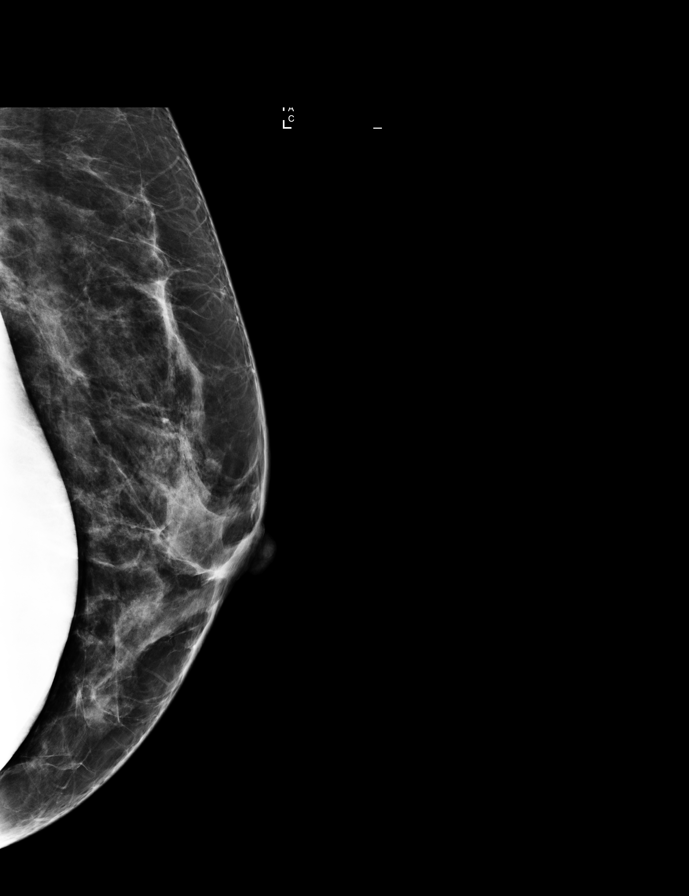

[L CC (2 of 2)]
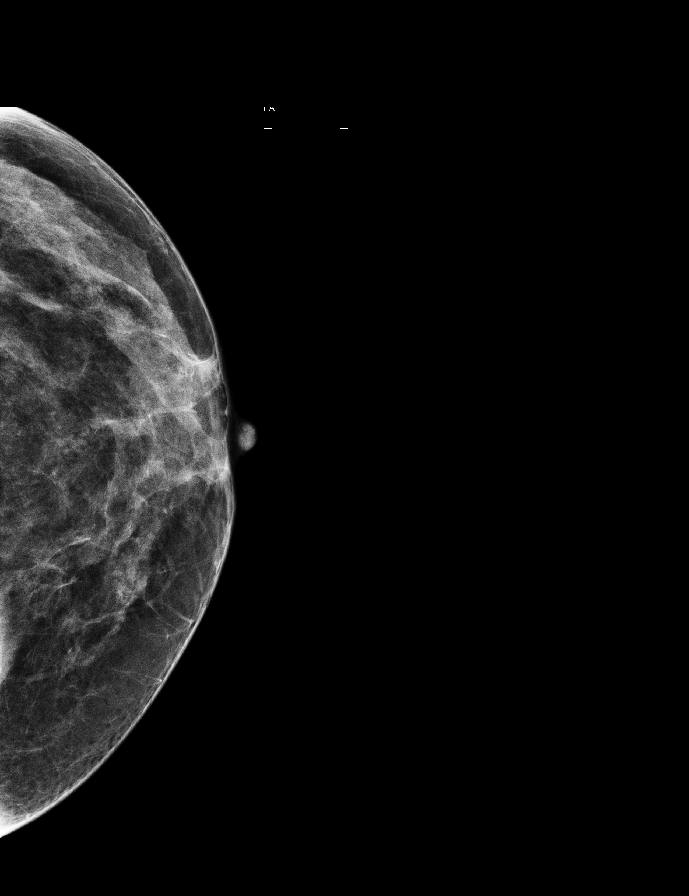

[R MLO (2 of 2)]
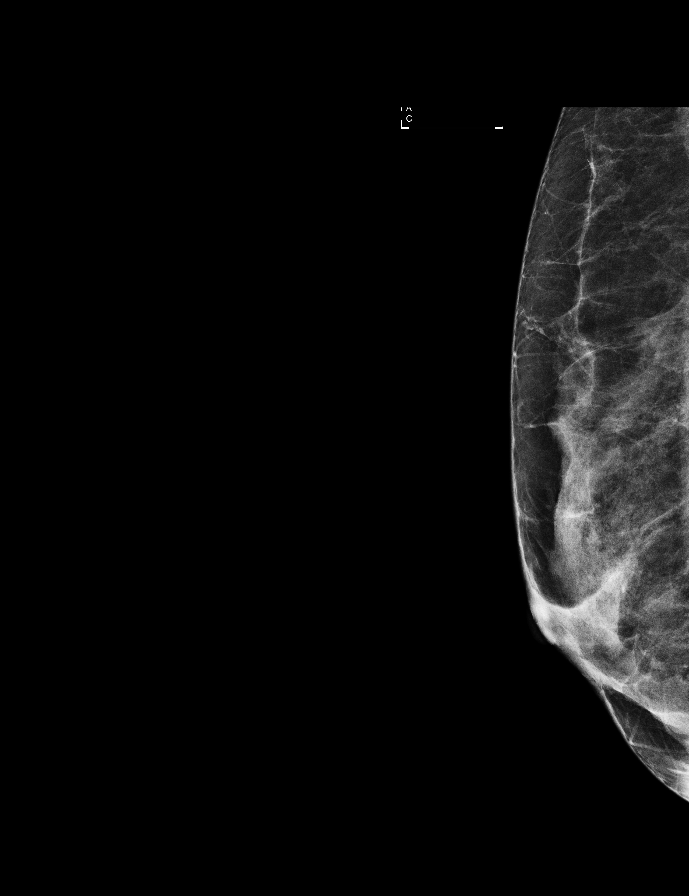

[R CC (2 of 2)]
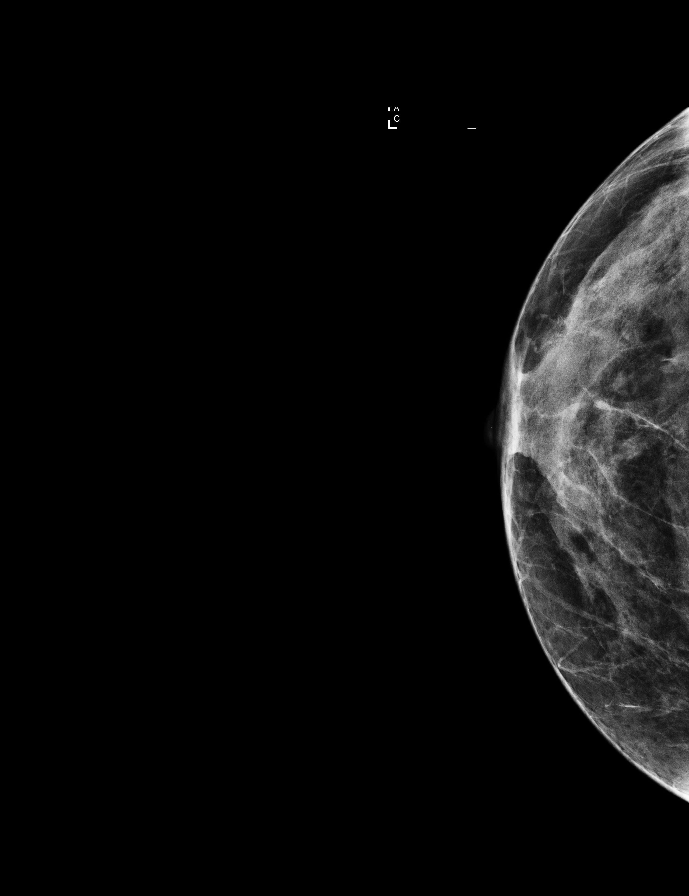

[L MLO tomo · tomo slice 27/52.0]
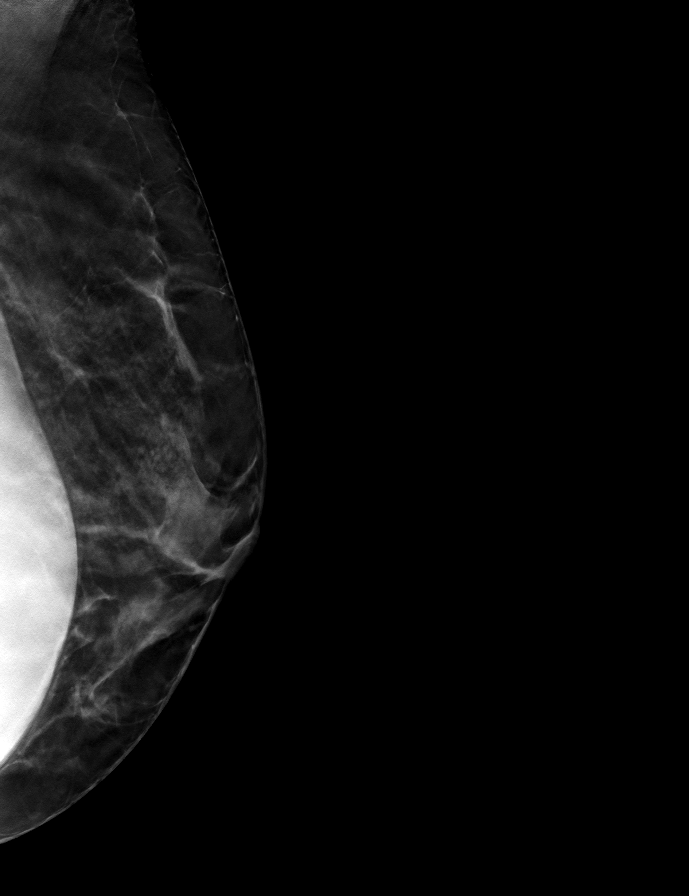

[9 of 28 positions shown; findings below may reference images not displayed]

IMPRESSION: There is no mammographic evidence of malignancy.
Screening mammogram recommended in 1 year.
BI-RADS Category 1: Negative

## 2020-12-22 IMAGING — MG MAMMO SCRN BIL W/CAD TOMO
8 of 12 series · 8 of 28 positions shown · non-contrast
Comparison: The present examination has been compared to prior imaging studies.

Images Obtained from [facility]
INDICATION: Screening.
TECHNIQUE: Bilateral 2-D digital screening mammogram was performed followed by 3-D tomosynthesis.  Current study was also evaluated with a computer aided detection (CAD) system.
MAMMOGRAM FINDINGS:
The breasts are heterogeneously dense, which may obscure small masses.
There are bilateral retro-pectoral silicone gel implants.
No suspicious abnormality is seen in either breast.

[R MLO (1 of 2)]
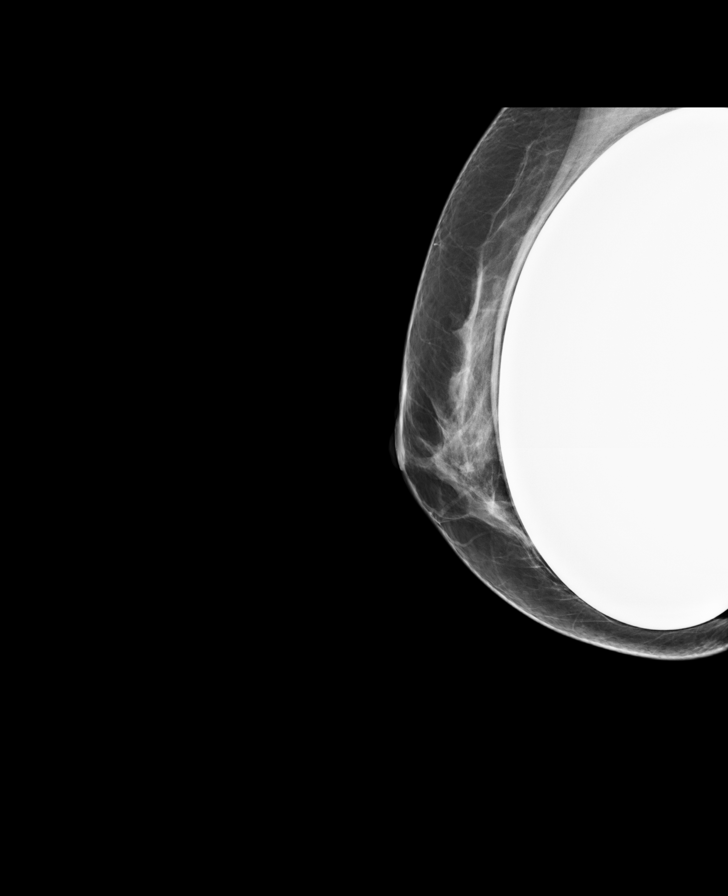

[L MLO (1 of 2)]
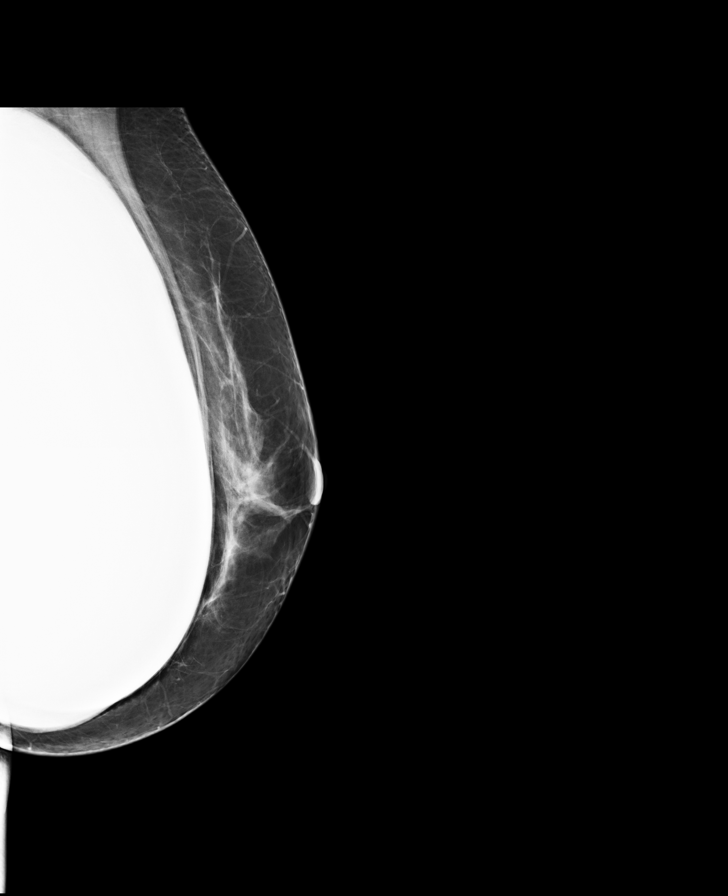

[R CC (1 of 2)]
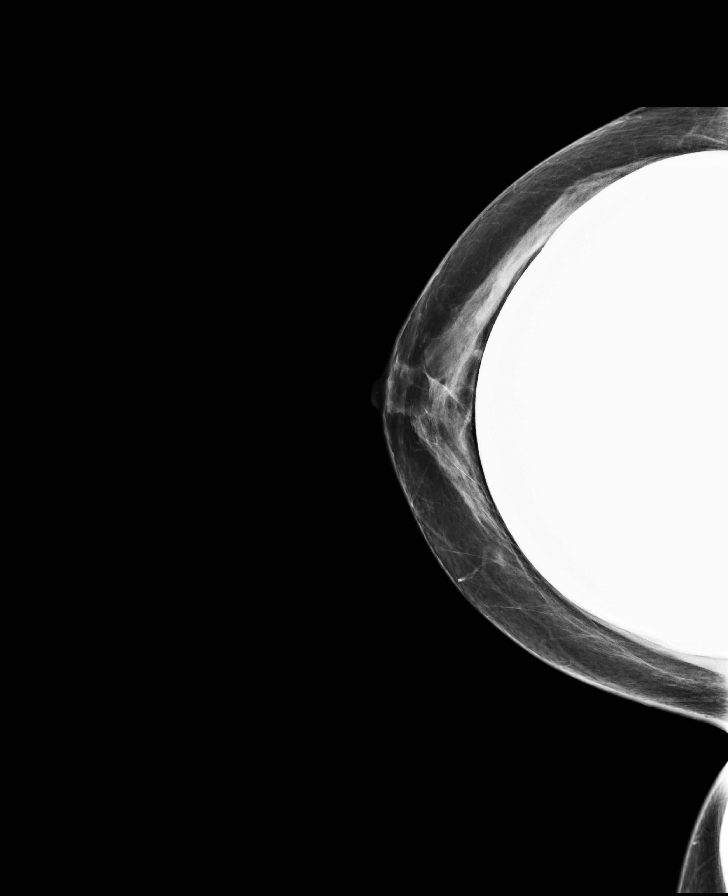

[L CC (1 of 2)]
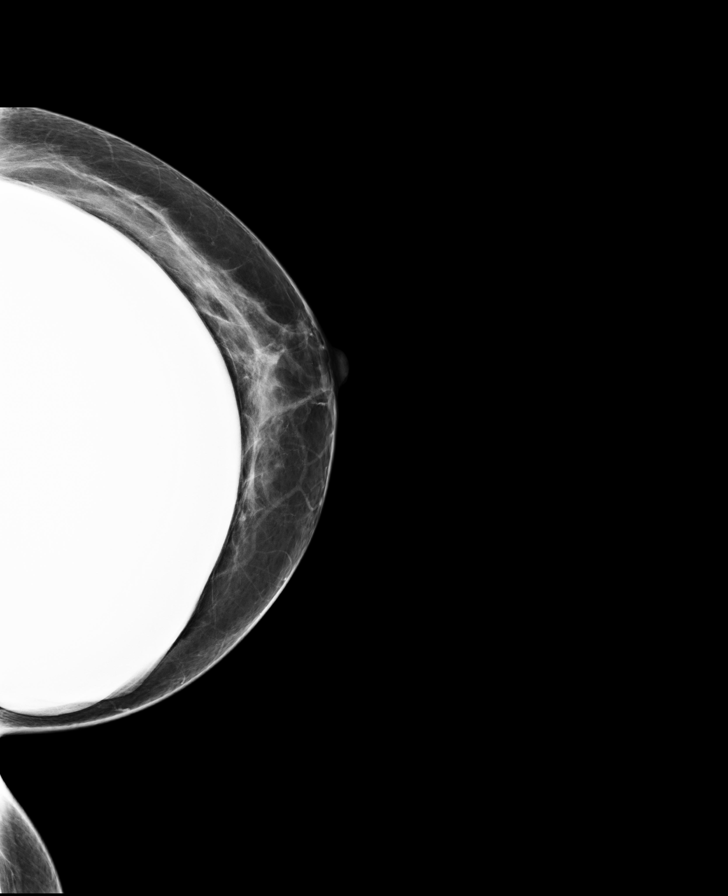

[L CC (2 of 2)]
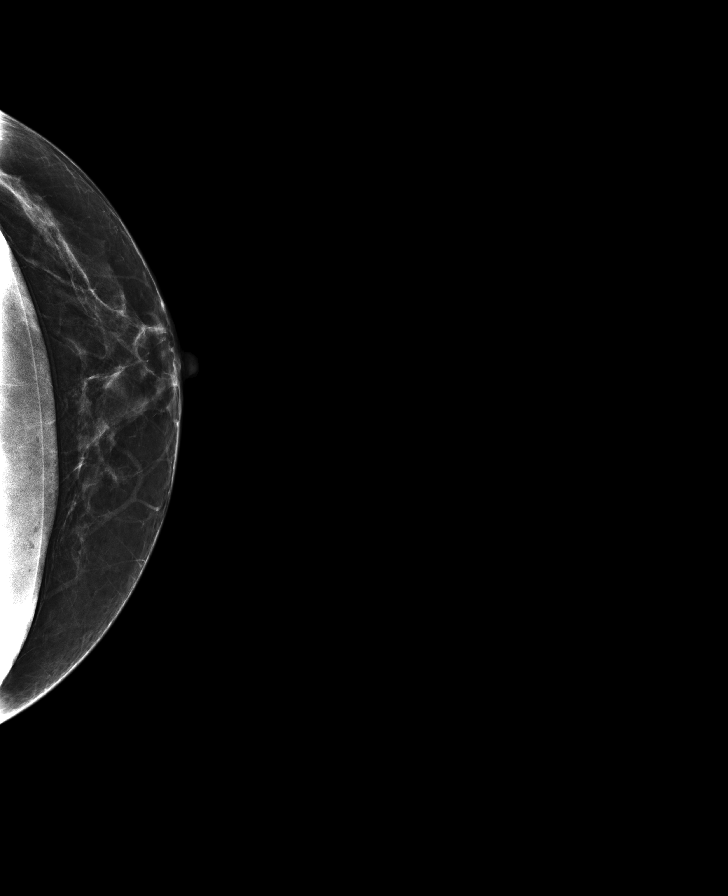

[R CC (2 of 2)]
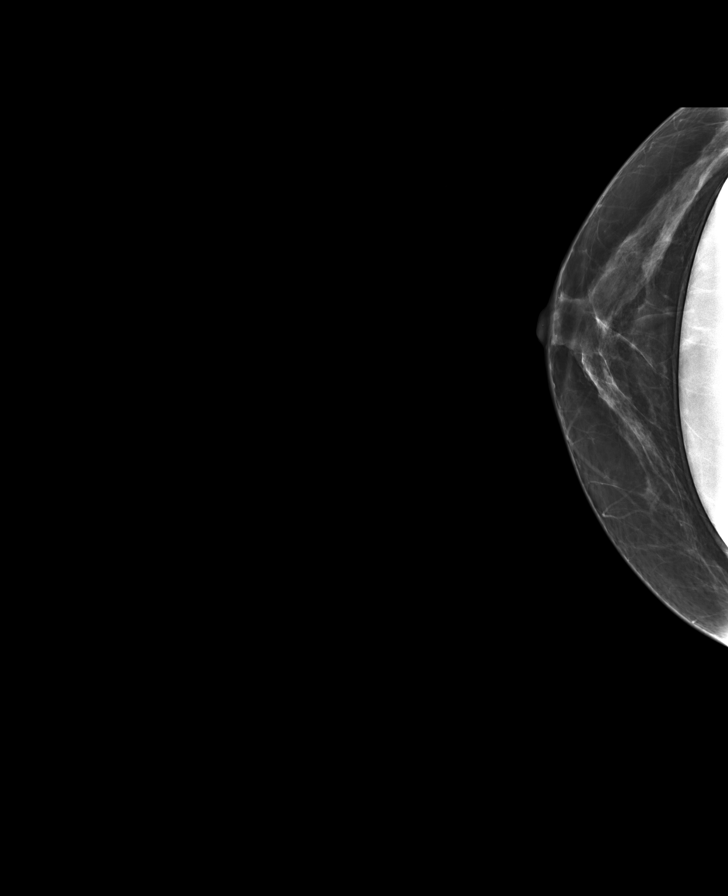

[L MLO (2 of 2)]
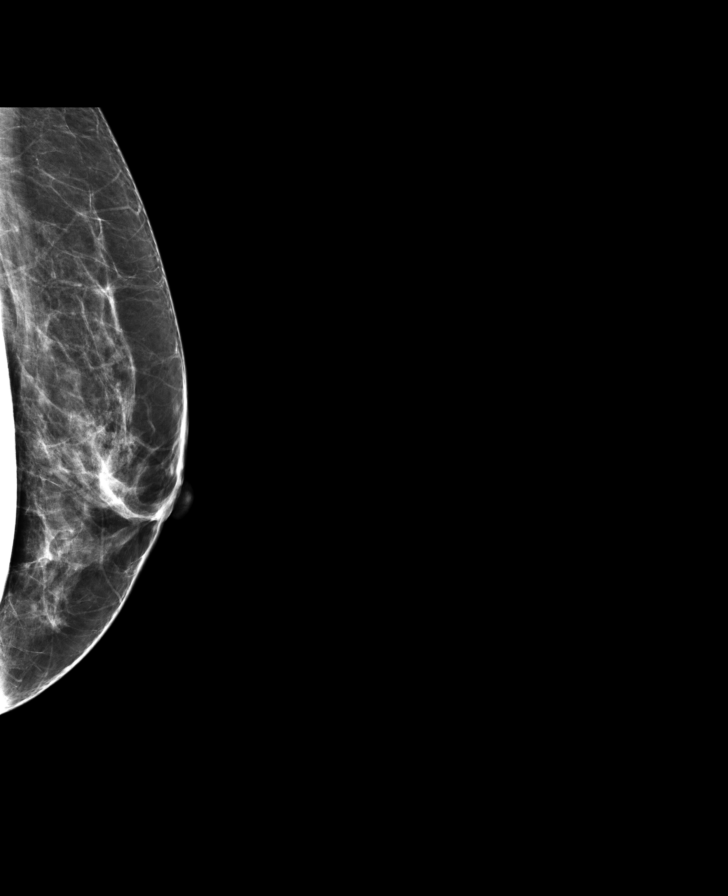

[R MLO (2 of 2)]
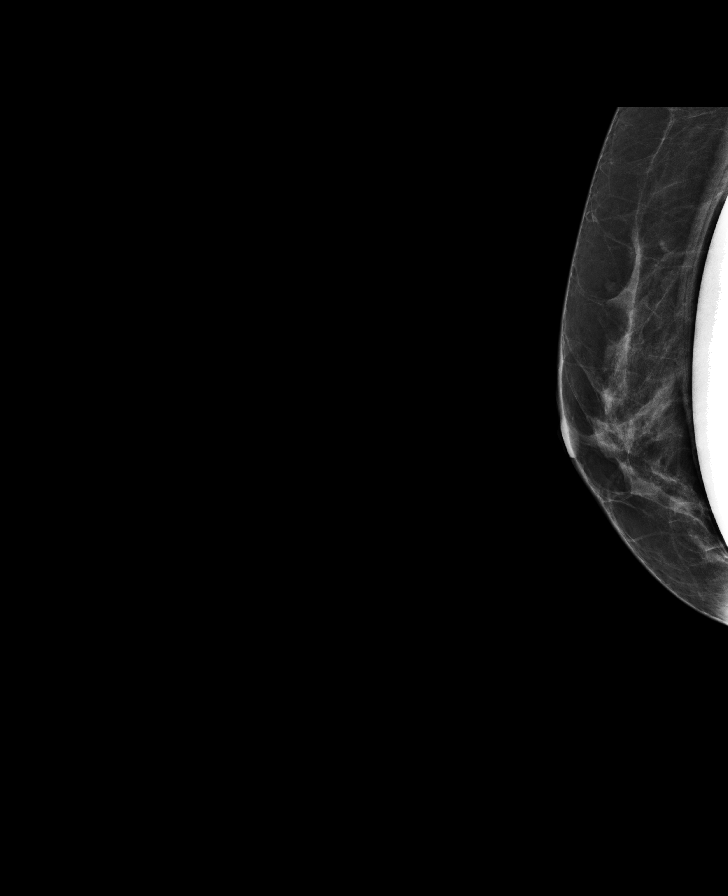

[8 of 28 positions shown; findings below may reference images not displayed]

IMPRESSION: There is no mammographic evidence of malignancy.
Screening mammogram recommended in 1 year.
BI-RADS Category 1: Negative

## 2021-05-05 IMAGING — MR MRI FOREFOOT LT WO CONTRAST
6 series · 40 of 40 positions shown · non-contrast
Comparison: Left foot radiographs, 04/24/2018.

INDICATION: Pain in left foot
TECHNIQUE: Multiplanar, multisequence imaging of the left forefoot was performed without contrast.

[Series 3: t2_sag_fs · sagittal · 3.0mm · 0.59mm/px · 6 of 26 slices shown]
[im 1/26]
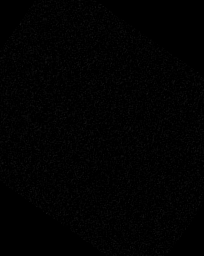
[im 6/26]
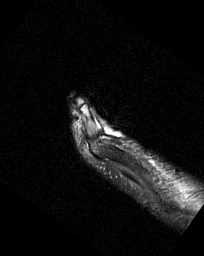
[im 11/26]
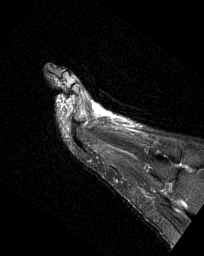
[im 16/26]
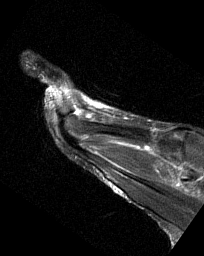
[im 21/26]
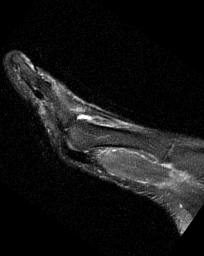
[im 26/26]
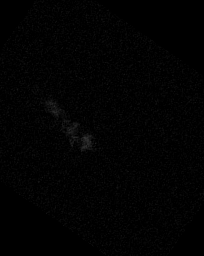

[Series 4: t1_sag · sagittal · 3.0mm · 0.47mm/px · 6 of 26 slices shown]
[im 1/26]
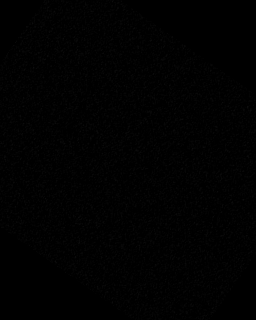
[im 6/26]
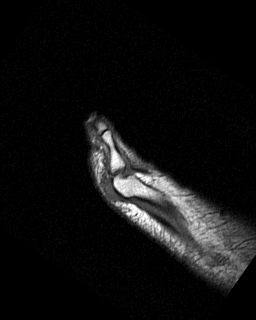
[im 11/26]
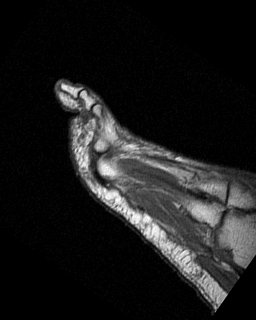
[im 16/26]
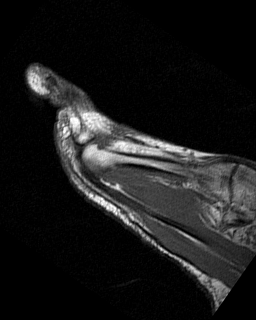
[im 21/26]
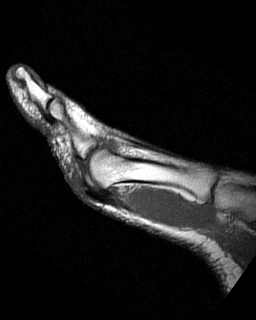
[im 26/26]
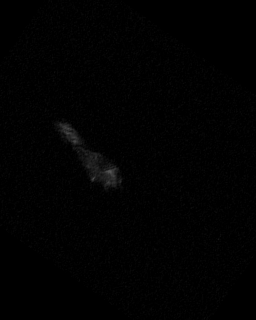

[Series 5: t1_cor · axial · 3.5mm · 0.47mm/px · z∈[-36,+87]mm · 8 of 30 slices shown]
[im 1/30]
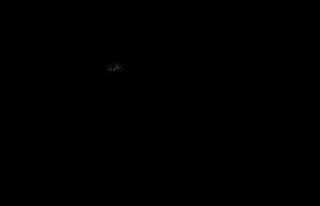
[im 5/30]
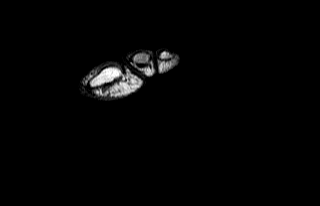
[im 9/30]
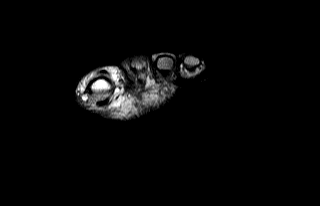
[im 13/30]
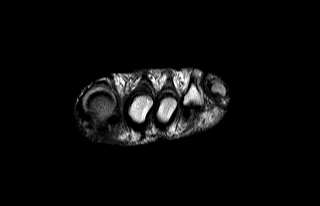
[im 17/30]
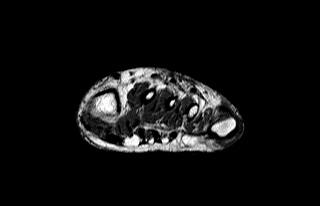
[im 21/30]
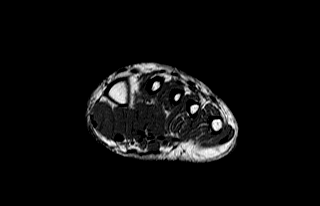
[im 25/30]
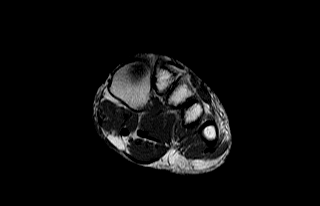
[im 30/30]
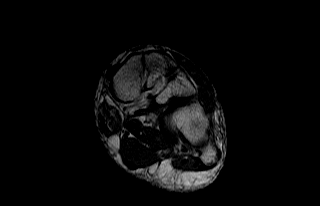

[Series 6: ir_cor · axial · 3.5mm · 0.59mm/px · z∈[-36,+87]mm · 8 of 30 slices shown]
[im 1/30]
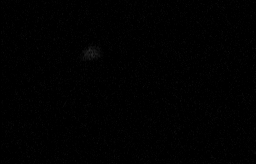
[im 5/30]
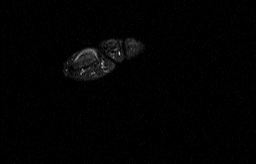
[im 9/30]
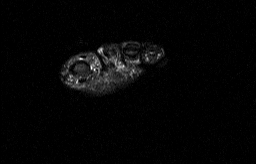
[im 13/30]
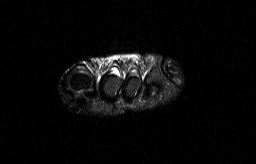
[im 17/30]
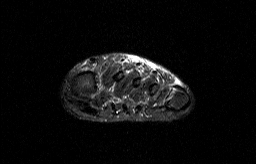
[im 21/30]
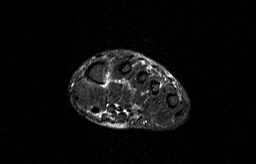
[im 25/30]
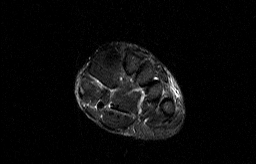
[im 30/30]
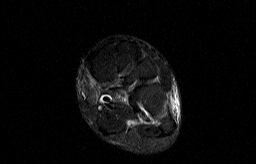

[Series 7: t1_axial · coronal · 3.0mm · 0.50mm/px · 6 of 23 slices shown]
[im 1/23]
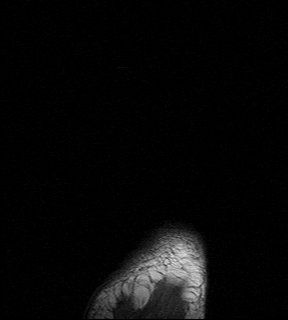
[im 5/23]
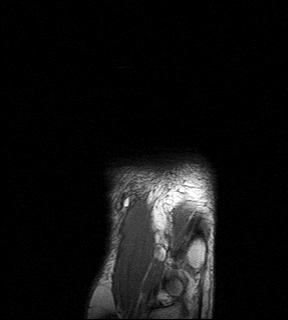
[im 9/23]
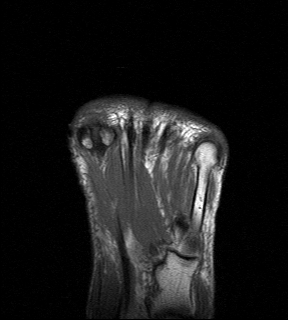
[im 14/23]
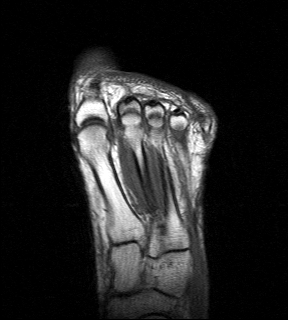
[im 18/23]
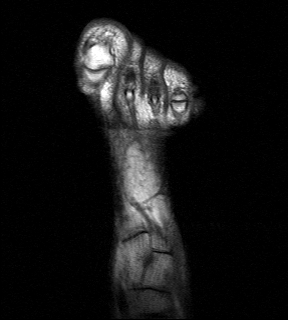
[im 23/23]
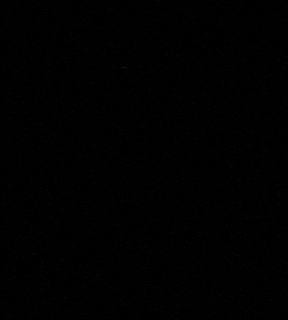

[Series 9: t2_axial_fs · coronal · 3.0mm · 0.62mm/px · 6 of 23 slices shown]
[im 1/23]
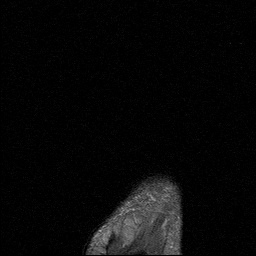
[im 5/23]
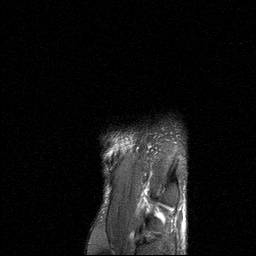
[im 9/23]
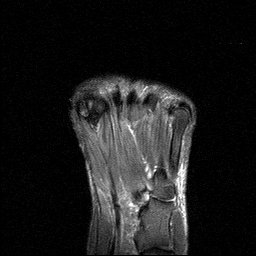
[im 14/23]
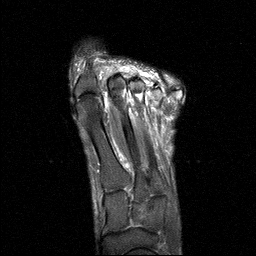
[im 18/23]
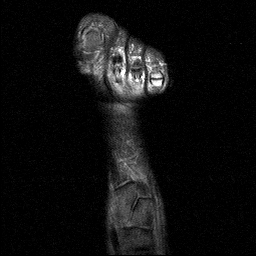
[im 23/23]
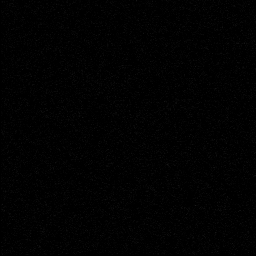

[40 of 40 positions shown; findings below may reference images not displayed]

FINDINGS: OSSEOUS: No acute fracture, avascular necrosis or aggressive osseous lesion. No stress-related marrow edema. Bipartite tibial hallux sesamoid, with mild edema along the synchondrosis.

LISFRANC LIGAMENT: Intact.

PLANTAR PLATE, MTP AND IP COLLATERAL LIGAMENTS:

Plantar plate: Intact.

Collateral ligaments: Intact.

MUSCULOTENDINOUS: 

Flexor tendons: The visualized flexor tendons are intact. No tenosynovitis.

Extensor tendons: The visualized extensor tendons are intact. No tenosynovitis.

Intramuscular edema: No intramuscular edema.

Fatty atrophy: None.

OTHER:

Perineural Fibrosis: None.

Intermetatarsal bursal fluid: No significant fluid within the intermetatarsal bursa.

Additional soft tissues: Moderate nonspecific subcutaneous soft tissue edema is present about the dorsal aspect of the foot at the level of the distal metatarsals.
IMPRESSION: 1.
No acute fracture or stress reaction of the left forefoot.

2.
Moderate nonspecific subcutaneous soft tissue edema about the dorsal aspect of the forefoot.

## 2021-08-06 IMAGING — MR MRI ANKLE LT WO CONTRAST
6 series · 40 of 40 positions shown · non-contrast
Comparison: Left ankle radiographs 08/03/2017

Images Obtained from [facility]
INDICATION: Sprain of other ligament of left ankle.
TECHNIQUE: Multiplanar, multiecho imaging of the left ankle was performed without contrast, including T1-weighted and fluid sensitive sequences without intravenous contrast.

[Series 4: t2_sag_fs r · sagittal · 3.0mm · 0.67mm/px · 5 of 21 slices shown]
[im 1/21]
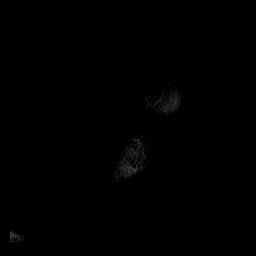
[im 6/21]
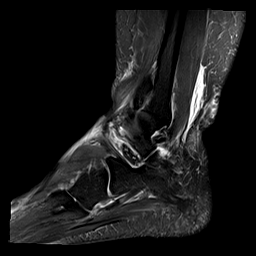
[im 11/21]
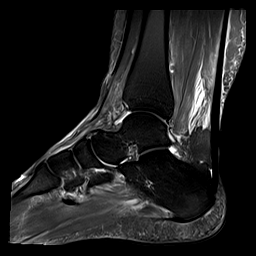
[im 16/21]
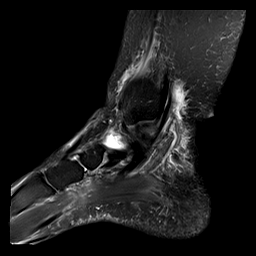
[im 21/21]
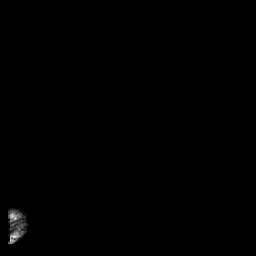

[Series 5: t1_sag · sagittal · 3.0mm · 0.54mm/px · 4 of 22 slices shown]
[im 1/22]
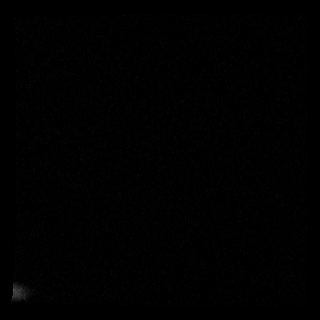
[im 8/22]
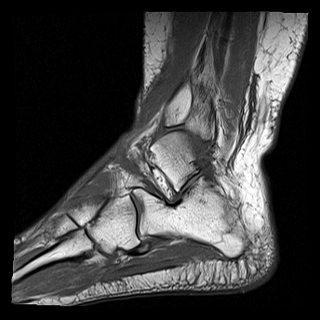
[im 15/22]
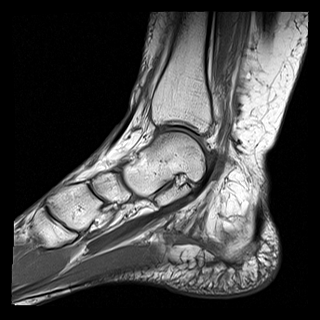
[im 22/22]
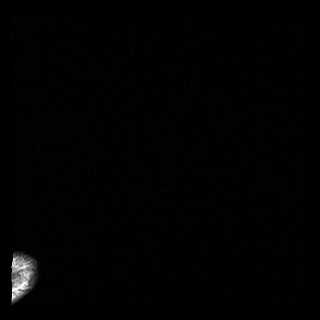

[Series 6: t1_axial · axial · 3.0mm · 0.62mm/px · z∈[-58,+82]mm · 8 of 40 slices shown]
[im 1/40]
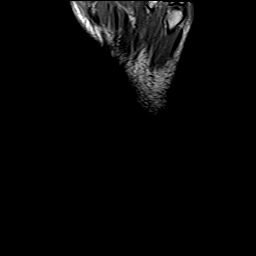
[im 6/40]
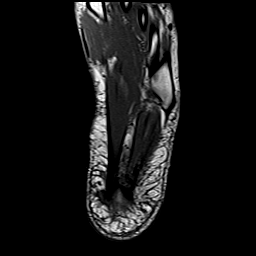
[im 12/40]
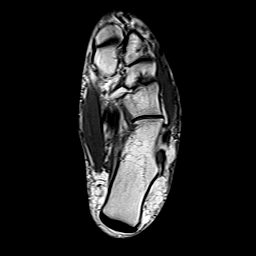
[im 17/40]
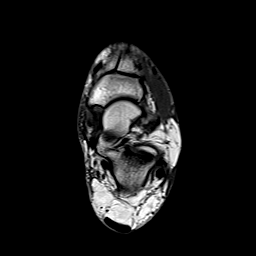
[im 23/40]
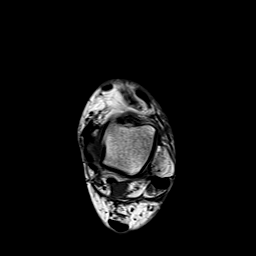
[im 28/40]
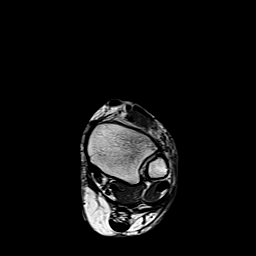
[im 34/40]
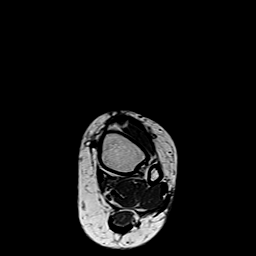
[im 40/40]
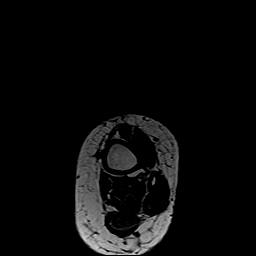

[Series 7: t2_axial_fs · axial · 3.0mm · 0.62mm/px · z∈[-59,+81]mm · 8 of 40 slices shown]
[im 1/40]
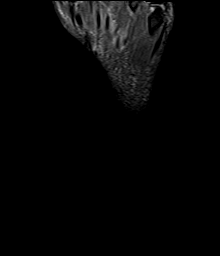
[im 6/40]
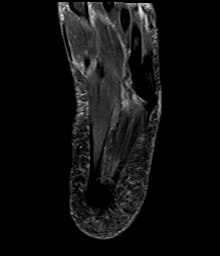
[im 12/40]
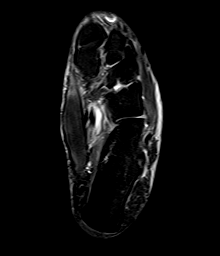
[im 17/40]
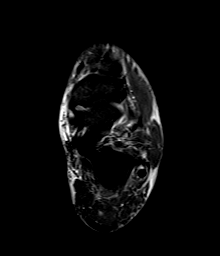
[im 23/40]
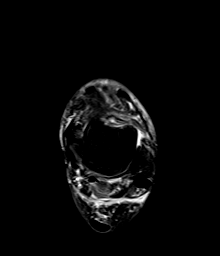
[im 28/40]
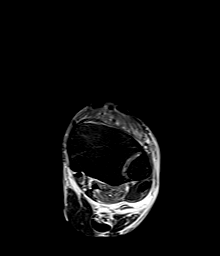
[im 34/40]
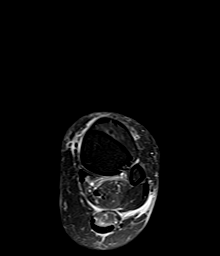
[im 40/40]
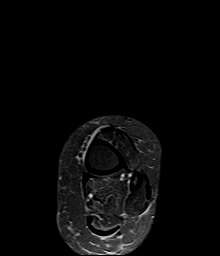

[Series 8: t2_cor_fs · coronal · 3.0mm · 0.59mm/px · 6 of 32 slices shown]
[im 1/32]
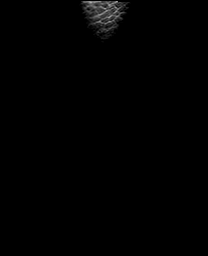
[im 7/32]
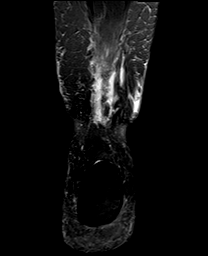
[im 13/32]
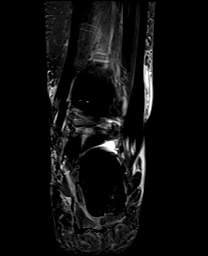
[im 19/32]
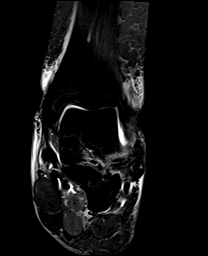
[im 25/32]
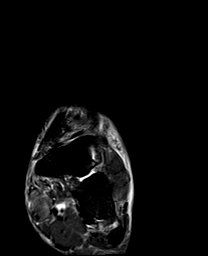
[im 32/32]
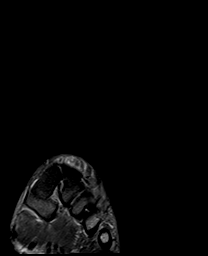

[Series 12: pd_obl_fs · axial · 3.0mm · 0.62mm/px · z∈[-42,+63]mm · 9 of 48 slices shown]
[im 1/48]
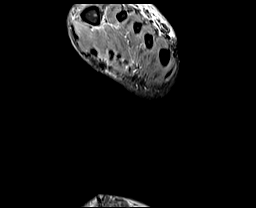
[im 6/48]
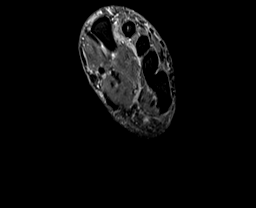
[im 12/48]
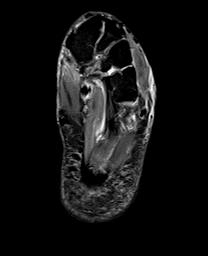
[im 18/48]
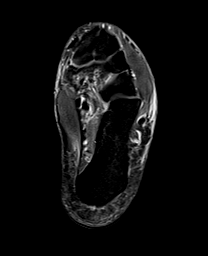
[im 24/48]
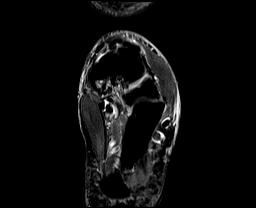
[im 30/48]
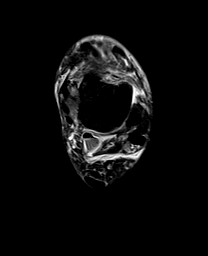
[im 36/48]
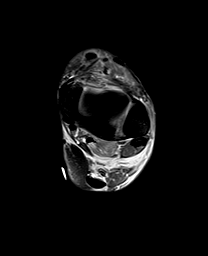
[im 42/48]
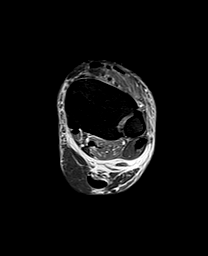
[im 48/48]
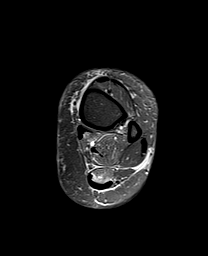

[40 of 40 positions shown; findings below may reference images not displayed]

FINDINGS: LIGAMENTS:
Anterior and posterior tibiofibular ligaments are intact.
Subacute ATFL rupture, at the talar attachment. Posterior talofibular ligament is intact. Subacute CFL rupture, at the fibular attachment.
Deltoid and spring ligaments are intact.
TENDONS:
Achilles: Intact.
Peroneus longus and brevis: Mild tenosynovitis of the lateral flexor tendons. No tendinosis or tear evident.
Posterior tibialis, flexor digitorum longus, and flexor hallucis longus: Intact.
Anterior extensor tendons: Intact.
OTHER:
Sinus tarsi: Normal.
Tarsal tunnel: Normal.
Plantar fascia: Normal.
Marrow and cartilage: No acute fracture. No erosion. No osteochondral lesion. No tarsal coalition. No chondral defects.
Moderate scattered subcutaneous densities edema about the ankle. Subacute strain of the distal soleus muscle, at the myotendinous junction. Subacute strain of the flexor hallucis longus muscle, along
the posterior aspect of the distal tibia. No tibiotalar joint effusion. No soft tissue mass. No fluid collection. Mild scarring and thickening of the dorsal talonavicular ligament.
IMPRESSION: 1.  Subacute ATFL and CFL ruptures.
2.  No acute osseous finding.
3.  Subacute strain of the distal soleus muscle, at the myotendinous junction.
4.  Subacute strain of the flexor hallucis longus muscle, along the posterior aspect of the distal tibia.

## 2022-05-06 NOTE — Progress Notes (Signed)
 Formatting of this note might be different from the original.  IV Dc'd. Pt discharged with family in stable condition.  Electronically signed by Patrice Books, RN at 05/06/2022  2:57 PM EDT

## 2022-05-06 NOTE — Progress Notes (Signed)
 Formatting of this note might be different from the original.  Pt awake and alert after IR procedure. Pt to IR holding for 1 hour recovery. Bilateral band-aids to lower back cdi, no bbh noted, pt has no complaints of pain. Pt may be discharged at 1500.  Electronically signed by Roselinda Congress, RN at 05/06/2022  2:03 PM EDT

## 2022-05-06 NOTE — Progress Notes (Signed)
 Formatting of this note might be different from the original.  Received pt back to IR [facility] for recovery. Site check, No BBH noted  Electronically signed by Patrice Books, RN at 05/06/2022  2:04 PM EDT

## 2022-05-06 NOTE — Progress Notes (Signed)
 Formatting of this note might be different from the original.  Received pt to IR [facility]  Electronically signed by Patrice Books, RN at 05/06/2022 12:44 PM EDT

## 2022-05-06 NOTE — Progress Notes (Signed)
 Formatting of this note might be different from the original.  Pt  given coffee and muffin.   Electronically signed by Patrice Books, RN at 05/06/2022  2:47 PM EDT

## 2022-05-06 NOTE — H&P (Signed)
 Formatting of this note is different from the original.  Images from the original note were not included.      HISTORY AND PHYSICAL     Patient Name: Lynn Russell MRN: [id number]   Date of Birth: [birth year] CSN: [id number]   Age: 47y.o. Attending Physician: Feliciano Horner, MD   Sex: female Referring Physician: [facility]     Date of Consultation: 05/06/2022  Chief Complaint: LBP, leg pain    History of Present Illness:   Lynn Russell is a 47y.o. female with a history of chronic low back pain with previous TF ESIs at L4-L5. The patient presents to interventional radiology for a repeat injection. She is currently following with Dr. Lonzo Robinson office. The L4-L5 ESIs have worked well for her, and they last for about 3 months. She otherwise has no acute complaints.     Past Medical History:      Past Medical History:   Diagnosis Date    Anxiety     Arthritis     back,hands,neck    Chronic pain     neck and lower back    Depression     Hypothyroidism     Neuropathy     legs,feet and right arm     Past Surgical History:      Past Surgical History:   Procedure Laterality Date    CARPAL TUNNEL RELEASE Right 2020    DISCECTOMY, CERVICAL  04/2020    [facility] CERVICAL FUSION  2016     Social History:      Social History     Socioeconomic History    Marital status: Divorced   Tobacco Use    Smoking status: Former     Packs/day: 1.00     Years: 22.00     Additional pack years: 0.00     Total pack years: 22.00     Types: Cigarettes     Quit date: 09/2019     Years since quitting: 2.6    Smokeless tobacco: Never   Vaping Use    Vaping Use: Never used   Substance and Sexual Activity    Alcohol use: Never    Drug use: Never     Family History:     No family history on file.    Current Medications:     Current Facility-Administered Medications:     sodium chloride 0.9 % infusion Cabinet Override, , , ,     Review of Systems:      Constitutional: [x]  No Problems []  Fever []  Chills []  Fatigue []  Weight Change   HEENT: [x]  No Problems []   Headache []  Sore Throat []  Nasal Congestion   []  Hearing Change    Cardiovascular: [x]  No Problems []  Chest Pain []  Irregular Heartbeat []  Palpitations  []  Edema   Respiratory: [x]  No Problems []  Dyspnea []  Cough []  Wheezing []  Hemoptysis    Gastrointestinal: [x]  No Problems []  Abdominal Pain []  Nausea/Vomiting []  Diarrhea   Genitourinary: [x]  No Problems []  Dysuria []  Pelvic Pain []  Hematuria  []  Incontinence []  Vaginal Discharge   Musculoskeletal: []  No Problems [x]  Pain []  Swelling []  Muscle Weakness   []  Gait Disturbance    Neurological: []  No Problems []  Dizziness []  Gait Disturbance []  Confusion  [x]  Numbness/Tingling    Dermatological: [x]  No Problems []  Rash []  Eczema []  Pruritus   Hematologic: [x]  No Problems []  Bruising []  Bleeding Problems []  Blood Clots     Physical Exam:  Vital Signs:  Heart Rate :  [84] 84  Resp:  [16] 16  BP: (161)/(93) 161/93    Physical Exam:  Constitution    Eyes:   [x]  Well Developed    []  Well nourished     []  Diaphoretic     []  Distressed  []      [x]  Normal EOM        []  No icterus             []  Icterus            []  Discharge   []      HENT:   [x]  Normocephalic    []  Atraumatic             []  No Lymphadenopathy     []      Pulmonary: [x]  Unlabored Breathing  []  Tachypnea   []  Clear to Auscultation Bilaterally   []         []  Decreased Breath Sounds Right  []  Decreased Breath Sounds Left  []  Ventilated     CVS:   []  Normal rate         []  Regular Rhythm          []  Irregular rrhythm     []      Abdomen: []  Soft   []  Nontender  []     []  RUQ Tenderness   []  LUQ Tenderness  []  RLQ Tenderness    []  LLQ Tenderness      Skin:   [x]  Warm                  [x]  Dry                  []  Erythema         []  Cyanosis  []      MSK:    Neuro:   []  No Edema  []   []  RUE Edema           []  LUE Edema  []  RUE Weakness  []  LUE Weakness []  RLE Edema  []  LLE Edema  []  RLE Weakness  []  LLE Weakness    [x]  Alert      [x]  Oriented x 3      []  Not Oriented  []        Labs:      CBC    @BRIEFLABS (WBC:3,HGB:3,PLT:3)@      Metabolic   @BRIEFLABS (K:3,BUN:3,CREAT:3,GFRAA:3)@     Liver   @BRIEFLABS (PROTUR:3,ALBUMIN:3,ALT:3,AST:3,LABBILI:3,ALKPHOS:3)@     Coagulation   @BRIEFLABS (APTT:3,PROTIM:3,INR:3)@     Pregnancy   @BRIEFLABS (HCGQUANT,UHCG,PREGNANCYTES)@     Sedation Assessment:      ASA Classification: Class 2 - A normal healthy patient with mild systemic disease  Mallampati Classification: II (soft palate, uvula, fauces visible)  History of Airway Problems: No  History of Anesthesia Problems: No  Sedation/Anesthesia Plan: fentanyl and versed , local lidocaine  NPO since: Midnight  Informed Consent: Yes from patient     Assessment:    Low back pain with bilateral lower extremity radiculopathy. Previous L4-L5 TF ESI with relief of symptoms    Plan:    Image-guided bilateral transforaminal epidural steroid injection at L4-L5. Patient agreeable to proceed.     Before the patient consented to the procedure, an explanation was provided to the patient (and/or representative) by me of the anticipated benefits, the likelihood of success, and potential risks, side effects, and complications of the procedure(s). The patient (and/or representative) acknowledged understanding what is expected and what may happen after the  procedure when recovering. The patient (and/or representative) was also informed of the significant alternatives and their associated risks, benefits and side effects, and the probable consequences of not having the procedure(s) performed. No guarantees, promises, or assurances have been made to the patient (and/or representative) about the results that may be obtained or the consequences that may follow the procedure(s).    10 minutes of face to face time was spent with the patient, at least 50% of which was involved counseling and coordinating care.    Bucky Cardinal, PA-C   Vascular and Interventional Radiology    Cosigned by Feliciano Horner, MD at 06/02/2022  4:05 PM  EDT  Electronically signed by Bucky Cardinal, PA-C at 05/06/2022  1:14 PM EDT  Electronically signed by Feliciano Horner, MD at 06/02/2022  4:05 PM EDT

## 2022-06-01 IMAGING — MG MAMMO SCRN BIL W/CAD TOMO (IMPLANTS)
8 of 12 series · 8 of 28 positions shown · non-contrast
Comparison: The present examination has been compared to prior imaging studies.

Images Obtained from [facility]
INDICATION: Screening.
TECHNIQUE: Bilateral 2-D digital screening mammogram was performed followed by 3-D tomosynthesis.  Current study was also evaluated with a computer aided detection (CAD) system.
MAMMOGRAM FINDINGS:
The breasts are heterogeneously dense, which may obscure small masses.
There are bilateral retro-pectoral silicone gel implants.
No suspicious abnormality is seen in either breast.  There are no significant changes from the prior study.

[R MLO (1 of 2)]
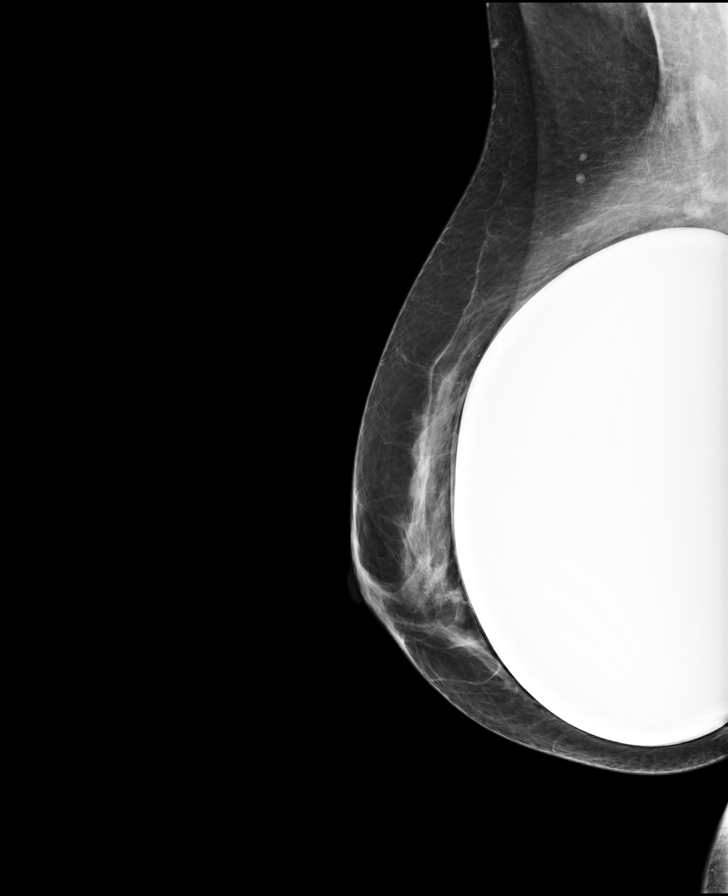

[R CC (1 of 2)]
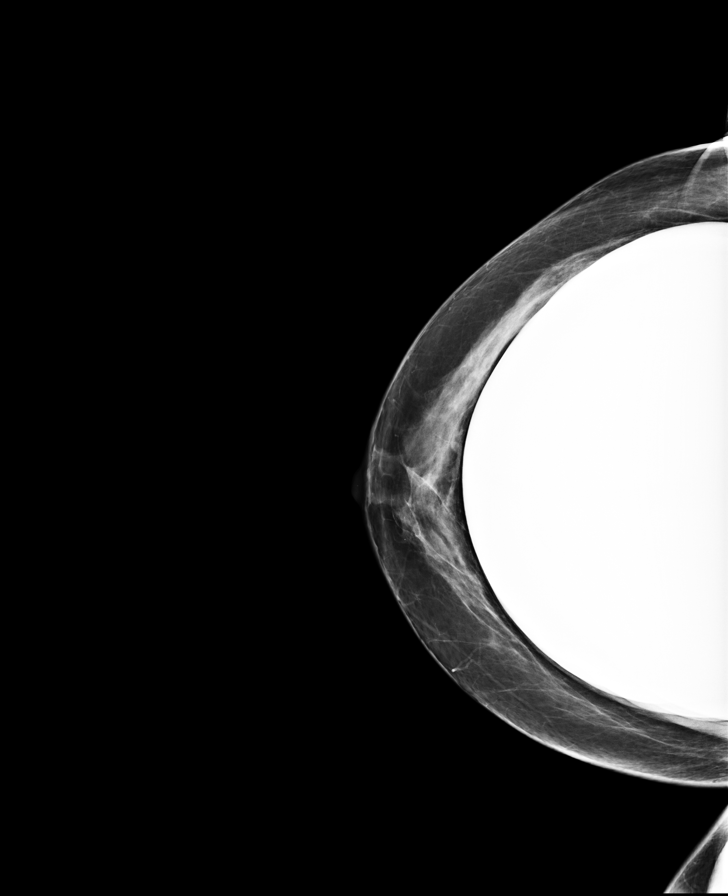

[L MLO (1 of 2)]
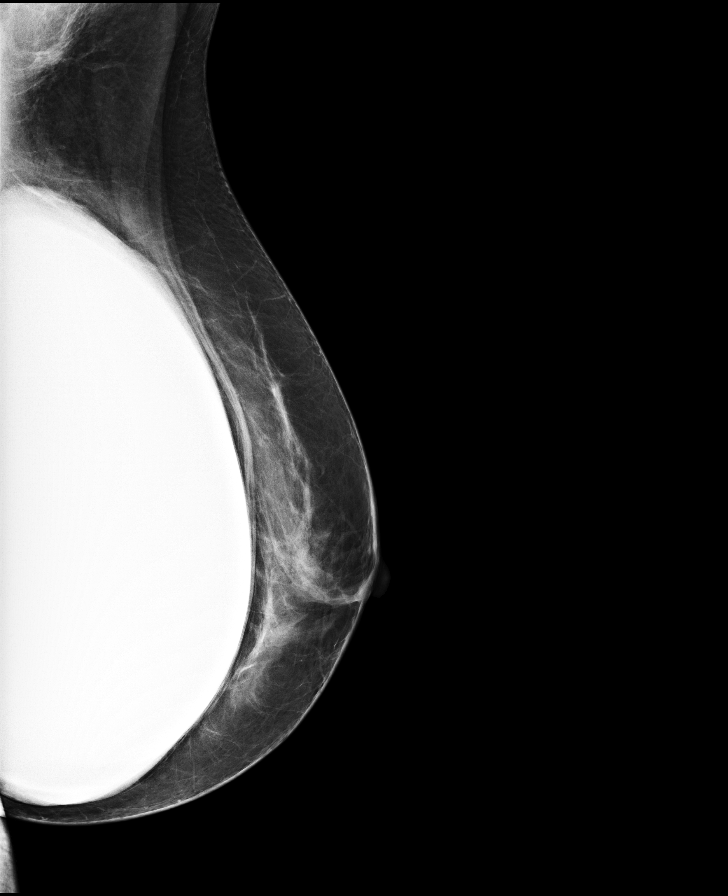

[L CC (1 of 2)]
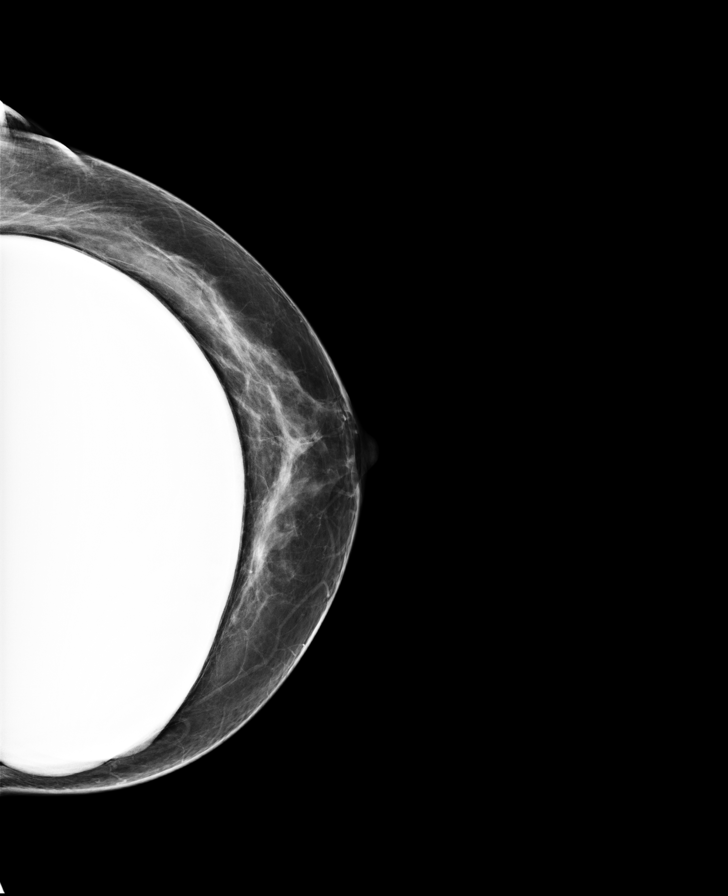

[L MLO (2 of 2)]
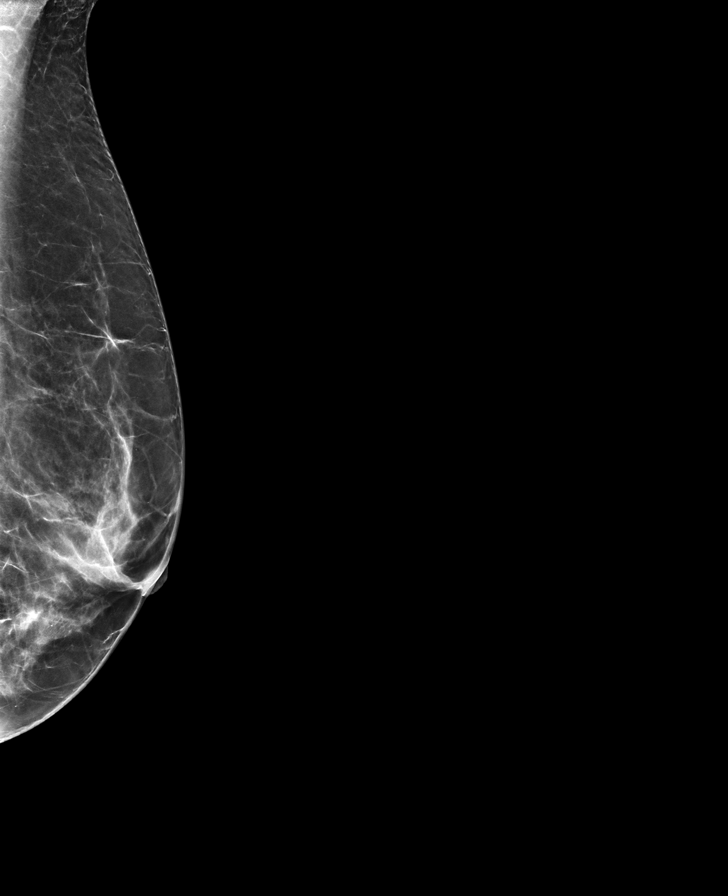

[R MLO (2 of 2)]
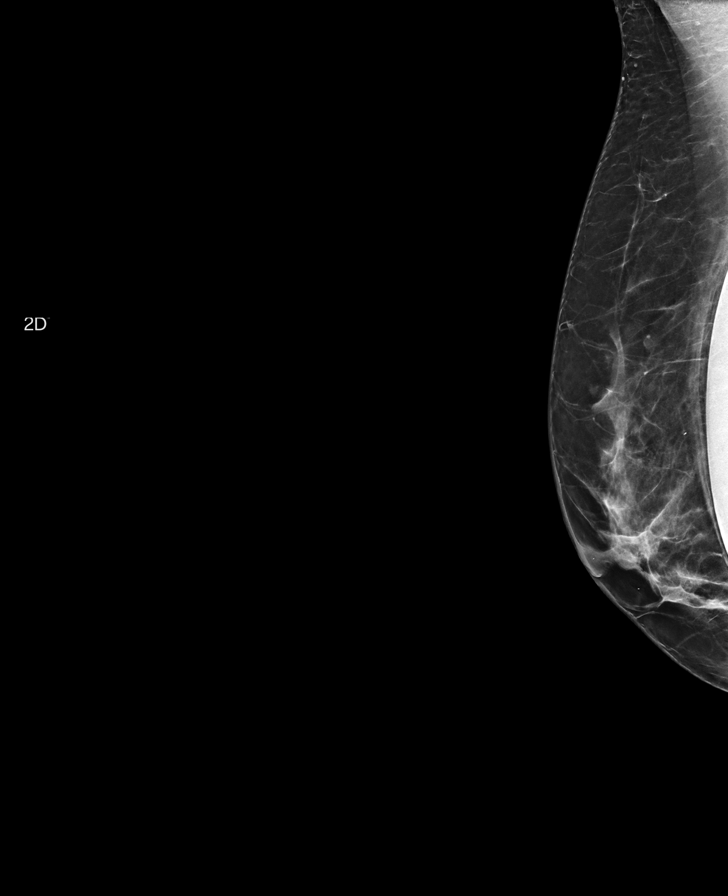

[L CC (2 of 2)]
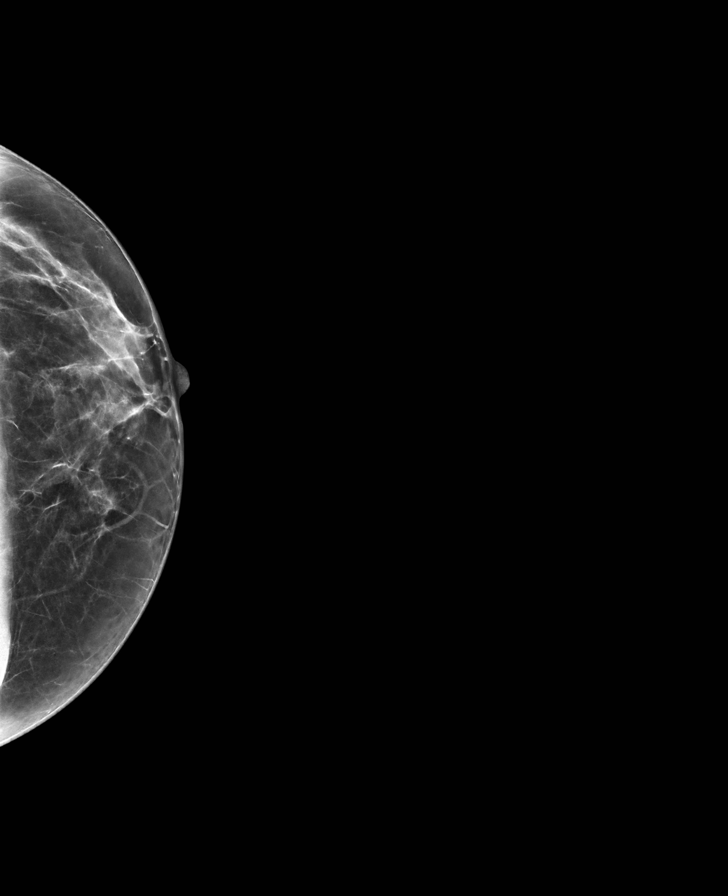

[R CC (2 of 2)]
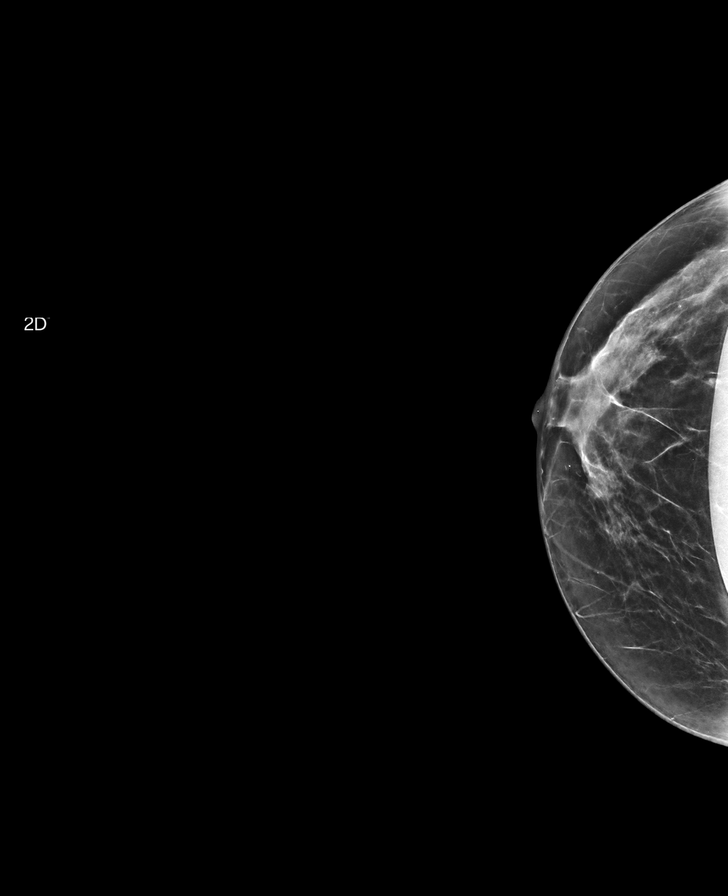

[8 of 28 positions shown; findings below may reference images not displayed]

IMPRESSION: There is no mammographic evidence of malignancy.
Screening mammogram recommended in 1 year.
BI-RADS Category 1: Negative

## 2022-10-15 LAB — TSH+T4F+T3FREE
T3, Free: 3.66 pg/mL
T4 Free: 1.43 ug/dL
TSH: 1.14 m[IU]/L (ref 0.470–4.680)

## 2023-06-21 LAB — TSH+T4F+T3FREE
T3, Free: 3.28 pg/mL
T4 Free: 1.41 ug/dL
TSH: 1.7 m[IU]/L (ref 0.470–4.680)

## 2023-06-21 LAB — VITAMIN D 25 HYDROXY: Vit D, 25-Hydroxy: 36.4 ng/mL

## 2023-08-24 ENCOUNTER — Ambulatory Visit
Admit: 2023-08-24 | Discharge: 2023-08-24 | Payer: Medicare (Managed Care) | Attending: Medical | Primary: Family Medicine

## 2023-08-24 VITALS — BP 128/91 | HR 77 | Ht 62.0 in | Wt 164.8 lb

## 2023-08-24 DIAGNOSIS — Z87828 Personal history of other (healed) physical injury and trauma: Principal | ICD-10-CM

## 2023-08-24 MED ORDER — PREGABALIN 150 MG PO CAPS
150 | ORAL_CAPSULE | Freq: Every day | ORAL | 2 refills | 30.00000 days | Status: DC
Start: 2023-08-24 — End: 2023-12-19

## 2023-08-24 NOTE — Telephone Encounter (Signed)
 Attempted to contact patient; left message to call back.
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
## 2023-08-24 NOTE — Telephone Encounter (Signed)
 Forms for [employer]

## 2023-08-30 NOTE — Telephone Encounter (Signed)
 Patient advised that neurology would not be able to complete this form; voiced understanding. She is coming to pick the form up.

## 2023-10-18 ENCOUNTER — Encounter

## 2023-11-10 ENCOUNTER — Encounter: Payer: Medicare (Managed Care) | Attending: Medical | Primary: Family Medicine

## 2023-12-15 ENCOUNTER — Encounter

## 2023-12-16 NOTE — Telephone Encounter (Signed)
"  Patient called in to inquire about refill, noting she is out.  "

## 2023-12-19 MED ORDER — PREGABALIN 150 MG PO CAPS
150 | ORAL_CAPSULE | ORAL | 2 refills | 30.00000 days | Status: AC
Start: 2023-12-19 — End: 2024-03-18
